# Patient Record
Sex: Female | Born: 1985 | Race: White | Hispanic: Yes | Marital: Married | Smoking: Never smoker
Health system: Southern US, Community
[De-identification: ages and names within clinical notes are randomized; demographics above are authoritative.]

## PROBLEM LIST (undated history)

## (undated) DIAGNOSIS — Z789 Other specified health status: Secondary | ICD-10-CM

## (undated) HISTORY — PX: NO PAST SURGERIES: SHX2092

---
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
## 2010-03-13 ENCOUNTER — Ambulatory Visit (HOSPITAL_COMMUNITY): Admission: RE | Admit: 2010-03-13 | Discharge: 2010-03-13 | Payer: Self-pay | Admitting: Family Medicine

## 2010-04-10 ENCOUNTER — Ambulatory Visit (HOSPITAL_COMMUNITY): Admission: RE | Admit: 2010-04-10 | Discharge: 2010-04-10 | Payer: Self-pay | Admitting: Family Medicine

## 2010-05-23 ENCOUNTER — Ambulatory Visit (HOSPITAL_COMMUNITY): Admission: RE | Admit: 2010-05-23 | Discharge: 2010-05-23 | Payer: Self-pay | Admitting: Family Medicine

## 2010-08-24 ENCOUNTER — Inpatient Hospital Stay (HOSPITAL_COMMUNITY)
Admission: AD | Admit: 2010-08-24 | Discharge: 2010-08-26 | Payer: Self-pay | Source: Home / Self Care | Admitting: Obstetrics & Gynecology

## 2010-12-01 LAB — CBC
HCT: 39.6 % (ref 36.0–46.0)
Hemoglobin: 12.9 g/dL (ref 12.0–15.0)
MCH: 25 pg — ABNORMAL LOW (ref 26.0–34.0)
MCHC: 32.4 g/dL (ref 30.0–36.0)
MCV: 77 fL — ABNORMAL LOW (ref 78.0–100.0)

## 2011-07-14 ENCOUNTER — Other Ambulatory Visit (HOSPITAL_COMMUNITY): Payer: Self-pay | Admitting: Nurse Practitioner

## 2011-07-14 DIAGNOSIS — Z3689 Encounter for other specified antenatal screening: Secondary | ICD-10-CM

## 2011-07-14 LAB — HEPATITIS B SURFACE ANTIGEN: Hepatitis B Surface Ag: NEGATIVE

## 2011-07-14 LAB — ABO/RH: RH Type: POSITIVE

## 2011-07-14 LAB — RPR: RPR: NONREACTIVE

## 2011-07-14 LAB — GC/CHLAMYDIA PROBE AMP, GENITAL: Chlamydia: NEGATIVE

## 2011-07-16 ENCOUNTER — Ambulatory Visit (HOSPITAL_COMMUNITY)
Admission: RE | Admit: 2011-07-16 | Discharge: 2011-07-16 | Disposition: A | Discharge status: Home / Self Care | Payer: Medicaid Other | Source: Ambulatory Visit | Attending: Nurse Practitioner | Admitting: Nurse Practitioner

## 2011-07-16 DIAGNOSIS — Z3689 Encounter for other specified antenatal screening: Secondary | ICD-10-CM

## 2011-07-16 DIAGNOSIS — Z1389 Encounter for screening for other disorder: Secondary | ICD-10-CM | POA: Insufficient documentation

## 2011-07-16 DIAGNOSIS — Z363 Encounter for antenatal screening for malformations: Secondary | ICD-10-CM | POA: Insufficient documentation

## 2011-07-16 DIAGNOSIS — O358XX0 Maternal care for other (suspected) fetal abnormality and damage, not applicable or unspecified: Secondary | ICD-10-CM | POA: Insufficient documentation

## 2011-09-22 NOTE — L&D Delivery Note (Signed)
Delivery Note At 7:45 PM a viable and healthy female was delivered via SVD(Presentation: LOA).  APGAR: 8,9 ; weight 3365.   Placenta status: intact, .  Cord: 3 vessel, with the following complications:none .    Anesthesia:  None Episiotomy: None Lacerations: None Suture Repair: n/a Est. Blood Loss (mL):  Mom to postpartum.  Baby to nursery-stable. Delivery supervised by Eino Farber [place] High 12/02/2011, 7:57 PM

## 2011-12-02 ENCOUNTER — Inpatient Hospital Stay (HOSPITAL_COMMUNITY)
Admission: AD | Admit: 2011-12-02 | Discharge: 2011-12-04 | DRG: 775 | Disposition: A | Discharge status: Home / Self Care | Payer: Medicaid Other | Source: Ambulatory Visit | Attending: Obstetrics & Gynecology | Admitting: Obstetrics & Gynecology

## 2011-12-02 ENCOUNTER — Encounter (HOSPITAL_COMMUNITY): Payer: Self-pay

## 2011-12-02 HISTORY — DX: Other specified health status: Z78.9

## 2011-12-02 LAB — CBC
MCH: 23.3 pg — ABNORMAL LOW (ref 26.0–34.0)
MCHC: 31.9 g/dL (ref 30.0–36.0)
Platelets: 213 10*3/uL (ref 150–400)
RDW: 14.7 % (ref 11.5–15.5)

## 2011-12-02 LAB — RPR: RPR Ser Ql: NONREACTIVE

## 2011-12-02 MED ORDER — LACTATED RINGERS IV SOLN
INTRAVENOUS | Status: DC
  Administered 2011-12-02: 17:00:00 via INTRAVENOUS

## 2011-12-02 MED ORDER — FLEET ENEMA 7-19 GM/118ML RE ENEM: 1.0000 | ENEMA | RECTAL | Status: DC | PRN

## 2011-12-02 MED ORDER — ONDANSETRON HCL 4 MG PO TABS: 4.0000 mg | ORAL_TABLET | ORAL | Status: DC | PRN

## 2011-12-02 MED ORDER — LIDOCAINE HCL (PF) 1 % IJ SOLN: 30.0000 mL | INTRAMUSCULAR | Status: DC | PRN

## 2011-12-02 MED ORDER — DIBUCAINE 1 % RE OINT: 1.0000 "application " | TOPICAL_OINTMENT | RECTAL | Status: DC | PRN

## 2011-12-02 MED ORDER — OXYTOCIN BOLUS FROM INFUSION: 500.0000 mL | Freq: Once | INTRAVENOUS | Status: DC

## 2011-12-02 MED ORDER — PRENATAL MULTIVITAMIN CH
1.0000 | ORAL_TABLET | Freq: Every day | ORAL | Status: DC
  Administered 2011-12-03 – 2011-12-04 (×2): 1 via ORAL
  Filled 2011-12-02 (×2): qty 1

## 2011-12-02 MED ORDER — OXYCODONE-ACETAMINOPHEN 5-325 MG PO TABS: 1.0000 | ORAL_TABLET | ORAL | Status: DC | PRN

## 2011-12-02 MED ORDER — OXYTOCIN 20 UNITS IN LACTATED RINGERS INFUSION - SIMPLE
125.0000 mL/h | Freq: Once | INTRAVENOUS | Status: AC
  Administered 2011-12-02: 500 mL/h via INTRAVENOUS
  Filled 2011-12-02: qty 1000

## 2011-12-02 MED ORDER — BUTORPHANOL TARTRATE 2 MG/ML IJ SOLN
1.0000 mg | INTRAMUSCULAR | Status: DC
  Administered 2011-12-02: 1 mg via INTRAVENOUS
  Filled 2011-12-02: qty 1

## 2011-12-02 MED ORDER — IBUPROFEN 600 MG PO TABS
600.0000 mg | ORAL_TABLET | Freq: Four times a day (QID) | ORAL | Status: DC
  Administered 2011-12-03 – 2011-12-04 (×7): 600 mg via ORAL
  Filled 2011-12-02 (×8): qty 1

## 2011-12-02 MED ORDER — ZOLPIDEM TARTRATE 5 MG PO TABS: 5.0000 mg | ORAL_TABLET | Freq: Every evening | ORAL | Status: DC | PRN

## 2011-12-02 MED ORDER — SIMETHICONE 80 MG PO CHEW: 80.0000 mg | CHEWABLE_TABLET | ORAL | Status: DC | PRN

## 2011-12-02 MED ORDER — LACTATED RINGERS IV SOLN: 500.0000 mL | INTRAVENOUS | Status: DC | PRN

## 2011-12-02 MED ORDER — ONDANSETRON HCL 4 MG/2ML IJ SOLN: 4.0000 mg | Freq: Four times a day (QID) | INTRAMUSCULAR | Status: DC | PRN

## 2011-12-02 MED ORDER — IBUPROFEN 600 MG PO TABS
600.0000 mg | ORAL_TABLET | Freq: Four times a day (QID) | ORAL | Status: DC | PRN
  Administered 2011-12-02: 600 mg via ORAL
  Filled 2011-12-02: qty 1

## 2011-12-02 MED ORDER — WITCH HAZEL-GLYCERIN EX PADS: 1.0000 "application " | MEDICATED_PAD | CUTANEOUS | Status: DC | PRN

## 2011-12-02 MED ORDER — ONDANSETRON HCL 4 MG/2ML IJ SOLN: 4.0000 mg | INTRAMUSCULAR | Status: DC | PRN

## 2011-12-02 MED ORDER — BUTORPHANOL TARTRATE 2 MG/ML IJ SOLN
1.0000 mg | INTRAMUSCULAR | Status: DC | PRN
  Administered 2011-12-02: 1 mg via INTRAVENOUS
  Filled 2011-12-02: qty 1

## 2011-12-02 MED ORDER — CITRIC ACID-SODIUM CITRATE 334-500 MG/5ML PO SOLN: 30.0000 mL | ORAL | Status: DC | PRN

## 2011-12-02 MED ORDER — LIDOCAINE HCL (PF) 1 % IJ SOLN
30.0000 mL | INTRAMUSCULAR | Status: DC | PRN
  Filled 2011-12-02: qty 30

## 2011-12-02 MED ORDER — SENNOSIDES-DOCUSATE SODIUM 8.6-50 MG PO TABS
2.0000 | ORAL_TABLET | Freq: Every day | ORAL | Status: DC
  Administered 2011-12-03: 2 via ORAL

## 2011-12-02 MED ORDER — OXYTOCIN BOLUS FROM INFUSION
500.0000 mL | Freq: Once | INTRAVENOUS | Status: DC
  Filled 2011-12-02: qty 500

## 2011-12-02 MED ORDER — TETANUS-DIPHTH-ACELL PERTUSSIS 5-2.5-18.5 LF-MCG/0.5 IM SUSP
0.5000 mL | Freq: Once | INTRAMUSCULAR | Status: AC
  Administered 2011-12-03: 0.5 mL via INTRAMUSCULAR
  Filled 2011-12-02: qty 0.5

## 2011-12-02 MED ORDER — ACETAMINOPHEN 325 MG PO TABS: 650.0000 mg | ORAL_TABLET | ORAL | Status: DC | PRN

## 2011-12-02 MED ORDER — IBUPROFEN 600 MG PO TABS: 600.0000 mg | ORAL_TABLET | Freq: Four times a day (QID) | ORAL | Status: DC | PRN

## 2011-12-02 MED ORDER — BENZOCAINE-MENTHOL 20-0.5 % EX AERO
1.0000 "application " | INHALATION_SPRAY | CUTANEOUS | Status: DC | PRN
  Administered 2011-12-03: 1 via TOPICAL

## 2011-12-02 MED ORDER — DIPHENHYDRAMINE HCL 25 MG PO CAPS: 25.0000 mg | ORAL_CAPSULE | Freq: Four times a day (QID) | ORAL | Status: DC | PRN

## 2011-12-02 MED ORDER — OXYCODONE-ACETAMINOPHEN 5-325 MG PO TABS
1.0000 | ORAL_TABLET | ORAL | Status: DC | PRN
  Administered 2011-12-02 – 2011-12-03 (×5): 1 via ORAL
  Filled 2011-12-02 (×5): qty 1

## 2011-12-02 MED ORDER — OXYTOCIN 20 UNITS IN LACTATED RINGERS INFUSION - SIMPLE: 125.0000 mL/h | Freq: Once | INTRAVENOUS | Status: DC

## 2011-12-02 MED ORDER — LANOLIN HYDROUS EX OINT: TOPICAL_OINTMENT | CUTANEOUS | Status: DC | PRN

## 2011-12-02 MED ORDER — LACTATED RINGERS IV SOLN: INTRAVENOUS | Status: DC

## 2011-12-02 NOTE — MAU Provider Note (Signed)
  History     CSN: [id number]  Arrival date and time: 12/02/11 1336   None     Chief Complaint  Patient presents with  . Labor Eval   HPI  Christy David is a 26 y.o. [id number] who presents at 38 weeks 4 days from the clinic. She was 3-4cm today. She has been having contractions since 10, and is now having them every 3 minutes. No discharge, bleeding or gush of fluid. No complications with pregnancy. EDD 12/12/11 based on U/S. Pediatrician is [facility]. Birth control will be Mirena. [facility] EDD 12/12/11 based on U/S.   Past Medical History  Diagnosis Date  . No pertinent past medical history     Past Surgical History  Procedure Date  . No past surgeries     Family History  Problem Relation Age of Onset  . Anesthesia problems Neg Hx     History  Substance Use Topics  . Smoking status: Never Smoker   . Smokeless tobacco: Never Used  . Alcohol Use: No    Allergies: No Known Allergies  Prescriptions prior to admission  Medication Sig Dispense Refill  . Prenatal Vit-Fe Fumarate-FA (PRENATAL MULTIVITAMIN) TABS Take 1 tablet by mouth daily.        Review of Systems  Constitutional: Negative for fever and diaphoresis.  Eyes: Negative for blurred vision, double vision and photophobia.  Respiratory: Negative for shortness of breath.   Cardiovascular: Negative for chest pain.  Gastrointestinal: Positive for abdominal pain (contractions). Negative for nausea, vomiting and diarrhea.  Genitourinary: Negative for dysuria and urgency.  Skin: Negative for rash.  Neurological: Negative for headaches.   Dilation: 3.5 Effacement (%): 80 Cervical Position: Middle Station: -2 Presentation: Vertex Exam by:: L.Paschal,RN  On recheck: Dilation: 4-5  Physical Exam   Blood pressure 120/75, pulse 92, temperature 97.2 F (36.2 C), temperature source Oral, resp. rate 20, height 5' 1.5" (1.562 m), weight 89.086 kg (196 lb 6.4  oz).  Physical Exam  Constitutional: She is oriented to person, place, and time. She appears well-developed and well-nourished. No distress.  HENT:  Head: Normocephalic and atraumatic.  Eyes: EOM are normal.  Neck: Normal range of motion.  Cardiovascular: Normal rate and regular rhythm.   Respiratory: Effort normal.  GI: Soft. She exhibits distension (gravid). There is no tenderness. There is no rebound and no guarding.  Musculoskeletal: Normal range of motion.  Neurological: She is alert and oriented to person, place, and time.  Skin: Skin is warm and dry. She is not diaphoretic. No erythema.  Psychiatric: She has a normal mood and affect. Her behavior is normal. Judgment and thought content normal.   GBS negative 1 hr glucose WNL  MAU Course  Procedures  FHR: 140 baseline, moderate variability, no accels, no decels Contractions every 4-5 minutes  Assessment and Plan   26 y.o. Christy David who presents at 38 weeks 4 days from the clinic IUP Admit for labor and delivery Anticipate NSVD Currently does not want an epidural Mirena for contraception Pediatrician [facility] Discussed and seen with Sid Falcon, CNM  Ala Dach 12/02/2011, 2:31 PM

## 2011-12-02 NOTE — H&P (Signed)
   Chief Complaint  Patient presents with  . Labor Eval   HPI  Christy David is a 25 y.o. [id number] who presents at 38 weeks 4 days from the clinic. She was 3-4cm today. She has been having contractions since 10, and is now having them every 3 minutes. No discharge, bleeding or gush of fluid. No complications with pregnancy. EDD 12/12/11 based on U/S. Pediatrician is [facility]. Birth control will be Mirena. [facility]. EDD 12/12/11 based on U/S.   OB History    Grav Para Term Preterm Abortions TAB SAB Ect Mult Living   3 2 2       2       Past Medical History  Diagnosis Date  . No pertinent past medical history     Past Surgical History  Procedure Date  . No past surgeries     Family History  Problem Relation Age of Onset  . Anesthesia problems Neg Hx     History  Substance Use Topics  . Smoking status: Never Smoker   . Smokeless tobacco: Never Used  . Alcohol Use: No    Allergies: No Known Allergies  Prescriptions prior to admission  Medication Sig Dispense Refill  . Prenatal Vit-Fe Fumarate-FA (PRENATAL MULTIVITAMIN) TABS Take 1 tablet by mouth daily.        Review of Systems  Constitutional: Negative for fever and diaphoresis.  Eyes: Negative for blurred vision, double vision and photophobia.  Respiratory: Negative for shortness of breath.   Cardiovascular: Negative for chest pain.  Gastrointestinal: Positive for abdominal pain (contractions). Negative for nausea, vomiting and diarrhea.  Genitourinary: Negative for dysuria and urgency.  Skin: Negative for rash.  Neurological: Negative for headaches.   Dilation: 3.5 Effacement (%): 80 Cervical Position: Middle Station: -2 Presentation: Vertex Exam by:: L.Paschal,RN  On recheck: Dilation: 4-5  Physical Exam   Blood pressure 120/75, pulse 92, temperature 97.2 F (36.2 C), temperature source Oral, resp. rate 20, height 5' 1.5" (1.562 m), weight 89.086  kg (196 lb 6.4 oz).  Physical Exam  Constitutional: She is oriented to person, place, and time. She appears well-developed and well-nourished. No distress.  HENT:  Head: Normocephalic and atraumatic.  Eyes: EOM are normal.  Neck: Normal range of motion.  Cardiovascular: Normal rate and regular rhythm.   Respiratory: Effort normal.  GI: Soft. She exhibits distension (gravid). There is no tenderness. There is no rebound and no guarding.  Musculoskeletal: Normal range of motion.  Neurological: She is alert and oriented to person, place, and time.  Skin: Skin is warm and dry. She is not diaphoretic. No erythema.  Psychiatric: She has a normal mood and affect. Her behavior is normal. Judgment and thought content normal.   GBS negative 1 hr glucose WNL  MAU Course  Procedures  FHR: 140 baseline, moderate variability, no accels, no decels Contractions every 4-5 minutes  Assessment and Plan   26 y.o. [id number] who presents at 38 weeks 4 days from the clinic IUP Admit for labor and delivery Anticipate NSVD Currently does not want an epidural Mirena for contraception Pediatrician [facility] Discussed and seen with Sid Falcon, CNM  Ala Dach 12/02/2011, 2:31 PM

## 2011-12-02 NOTE — Progress Notes (Signed)
Subjective: Patient having increased pressure, discomfort with contractions.  Objective: BP 123/80  Pulse 112  Temp(Src) 98 F (36.7 C) (Oral)  Resp 20  Ht 5' 1.5" (1.562 m)  Wt 89.086 kg (196 lb 6.4 oz)  BMI 36.51 kg/m2      FHT:  FHR: 135 bpm, variability: moderate,  accelerations:  Abscent,  decelerations:  Absent UC:   regular, every 3-4 minutes SVE:   Dilation: 6 Effacement (%): 100 Station: -2 Exam by:: lee  Labs: Lab Results  Component Value Date   WBC 10.5 12/02/2011   HGB 10.8* 12/02/2011   HCT 33.9* 12/02/2011   MCV 73.2* 12/02/2011   PLT 213 12/02/2011    Assessment / Plan: Spontaneous labor, progressing normally  Labor: Progressing normally Preeclampsia:  n/a Fetal Wellbeing:  Category I Pain Control:  Labor support with stadol I/D:  n/a Anticipated MOD:  NSVD  Ala Dach 12/02/2011, 6:34 PM

## 2011-12-02 NOTE — MAU Note (Signed)
Patient state contractions every 15 minutes. Denies any bleeding or leaking. Reports good fetal movement.

## 2011-12-02 NOTE — Progress Notes (Signed)
Pt transferred to 136 via wheelchair with support person present at 2105.  Report given to [place] on mbw

## 2011-12-03 MED ORDER — BENZOCAINE-MENTHOL 20-0.5 % EX AERO
INHALATION_SPRAY | CUTANEOUS | Status: AC
  Administered 2011-12-03: 1 via TOPICAL
  Filled 2011-12-03: qty 56

## 2011-12-03 NOTE — Progress Notes (Signed)
Post Partum Day 1 Subjective: no complaints, up ad lib and voiding No stool yet. Nothing to eat yet.  Objective: Blood pressure 101/70, pulse 59, temperature 98 F (36.7 C), temperature source Oral, resp. rate 18, height 5' 1.5" (1.562 m), weight 89.086 kg (196 lb 6.4 oz), unknown if currently breastfeeding.  Physical Exam:  General: alert, cooperative and no distress Lochia: appropriate Uterine Fundus: firm Incision: n/a DVT Evaluation: No evidence of DVT seen on physical exam. Negative Homan's sign.   Basename 12/02/11 1700  HGB 10.8*  HCT 33.9*    Assessment/Plan: Plan for discharge tomorrow, Breastfeeding and Contraception Mirena   LOS: 1 day   Ala Dach 12/03/2011, 7:29 AM

## 2011-12-03 NOTE — MAU Provider Note (Signed)
Attestation of Attending Supervision of Resident: Evaluation and management procedures were performed by the [facility] Medicine Resident under my supervision.  I have reviewed the resident's note and chart, and I agree with management and plan.  Jaynie Collins, M.D. 12/03/2011 9:06 AM

## 2011-12-03 NOTE — H&P (Signed)
Attestation of Attending Supervision of Advanced Practitioner: Evaluation and management procedures were performed by the PA/NP/CNM/OB Fellow under my supervision/collaboration. Chart reviewed, and agree with management and plan.  Jaynie Collins, M.D. 12/03/2011 9:07 AM

## 2011-12-04 MED ORDER — IBUPROFEN 600 MG PO TABS: 600.0000 mg | ORAL_TABLET | Freq: Four times a day (QID) | ORAL | Status: AC

## 2011-12-04 NOTE — Progress Notes (Signed)
Post Partum Day 2 Subjective: no complaints, up ad lib, voiding, tolerating PO and + flatus  Objective: Blood pressure 95/61, pulse 61, temperature 98 F (36.7 C), temperature source Oral, resp. rate 18, height 5' 1.5" (1.562 m), weight 89.086 kg (196 lb 6.4 oz), unknown if currently breastfeeding.  Physical Exam:  General: alert, cooperative and no distress Lochia: appropriate Uterine Fundus: firm Incision: n/a DVT Evaluation: No evidence of DVT seen on physical exam. Negative Homan's sign.   Basename 12/02/11 1700  HGB 10.8*  HCT 33.9*    Assessment/Plan: Discharge home, Breastfeeding and Contraception Mirena   LOS: 2 days   Ala Dach 12/04/2011, 7:40 AM

## 2011-12-04 NOTE — Discharge Summary (Signed)
Obstetric Discharge Summary Reason for Admission: onset of labor Prenatal Procedures: none Intrapartum Procedures: spontaneous vaginal delivery Postpartum Procedures: none Complications-Operative and Postpartum: none Hemoglobin  Date Value Range Status  12/02/2011 10.8* 12.0-15.0 (g/dL) Final     HCT  Date Value Range Status  12/02/2011 33.9* 36.0-46.0 (%) Final    Physical Exam:  General: alert, cooperative and no distress Lochia: appropriate Uterine Fundus: firm Incision: n/a DVT Evaluation: No evidence of DVT seen on physical exam. Negative Homan's sign.  Discharge Diagnoses: Term Pregnancy-delivered  Discharge Information: Date: 12/04/2011 Activity: unrestricted Diet: routine Medications: Ibuprofen Condition: stable and improved Instructions: refer to practice specific booklet Discharge to: home Follow-up Information    Follow up with [facility] [place]. Schedule an appointment as soon as possible for a visit in 6 weeks.   Contact information:   [address] [employer] [place] [address]          Newborn Data: Live born female  Birth Weight: 7 lb 6.7 oz (3365 g) APGAR: 8, 9  Home with mother.  Ala Dach 12/04/2011, 7:42 AM

## 2011-12-04 NOTE — Discharge Instructions (Signed)

## 2011-12-04 NOTE — Progress Notes (Signed)
UR chart review completed.  

## 2012-10-05 IMAGING — US US OB DETAIL+14 WK
2 series · 12 of 28 positions shown · non-contrast
Comparison: none

[Series 1: us ob detail +14 wk · 11 of 50 slices shown (1 of 2)]
[im 3/50]
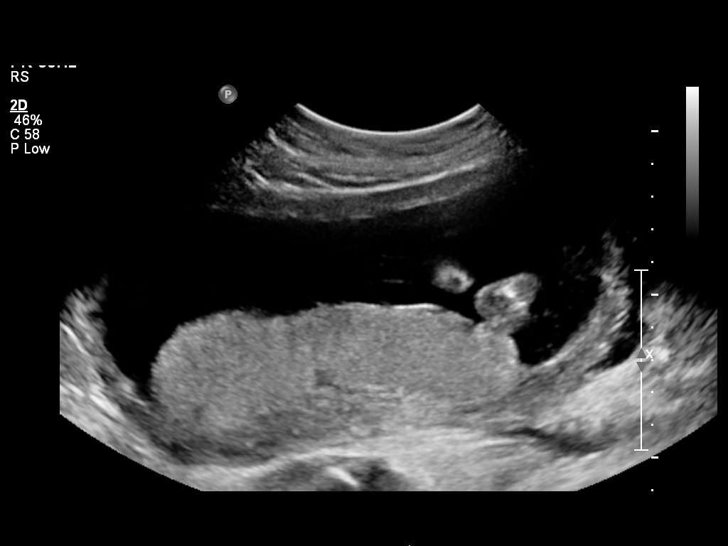
[im 7/50]
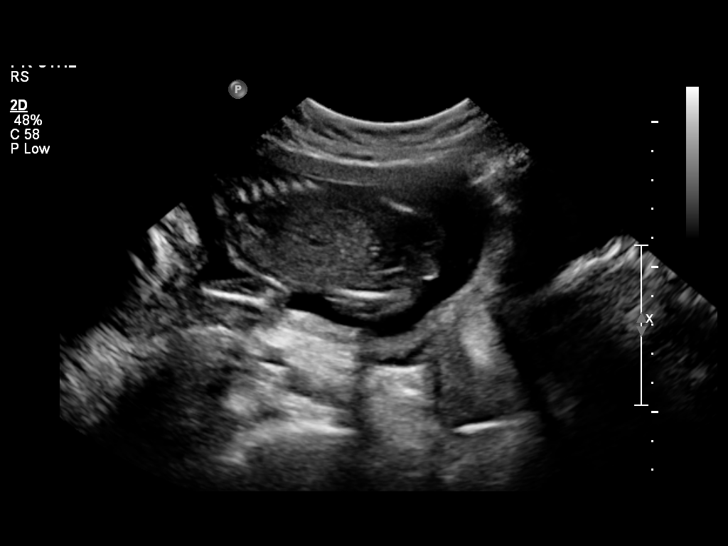
[im 11/50]
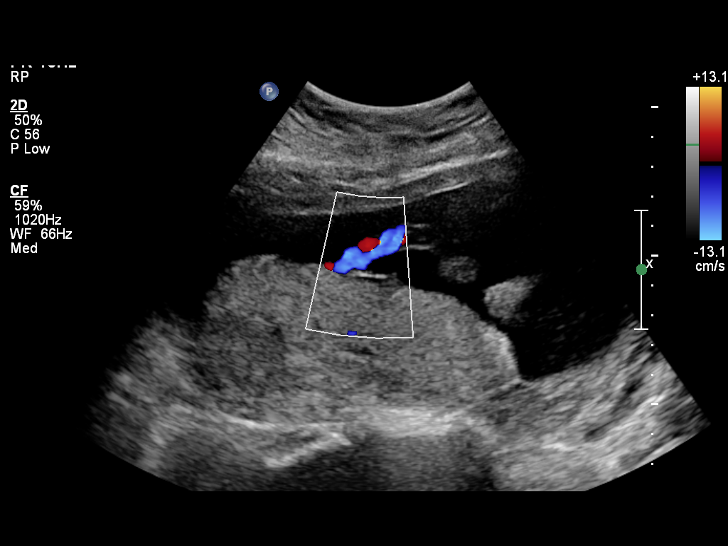
[im 17/50]
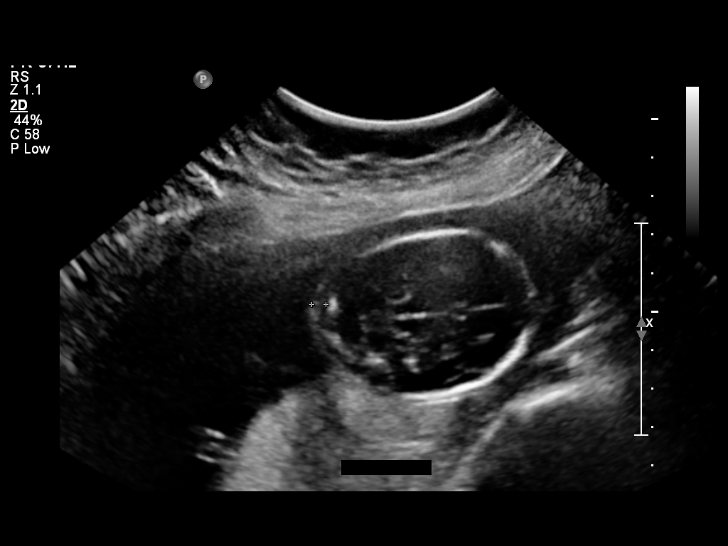
[im 21/50]
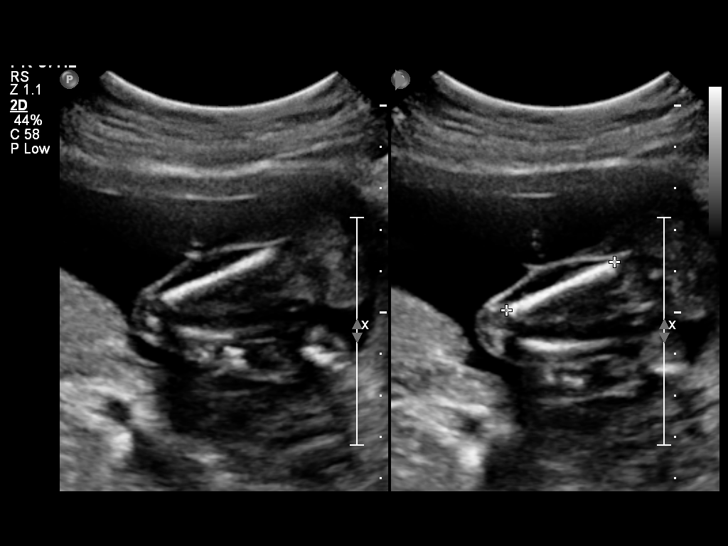
[im 25/50]
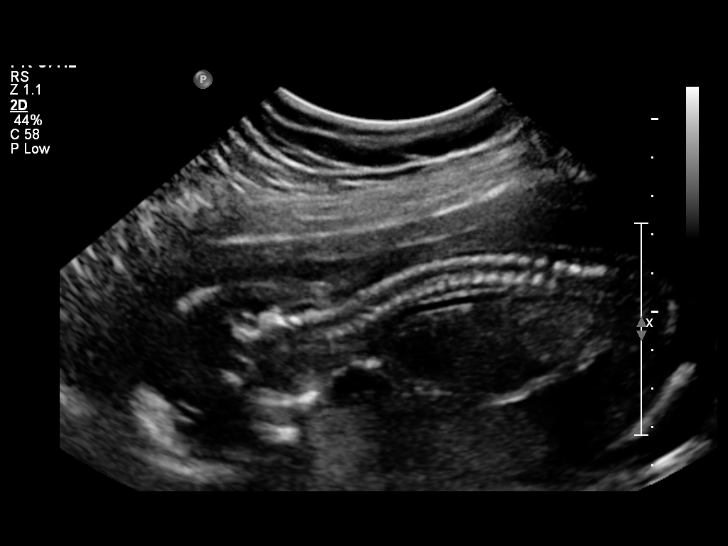
[im 31/50]
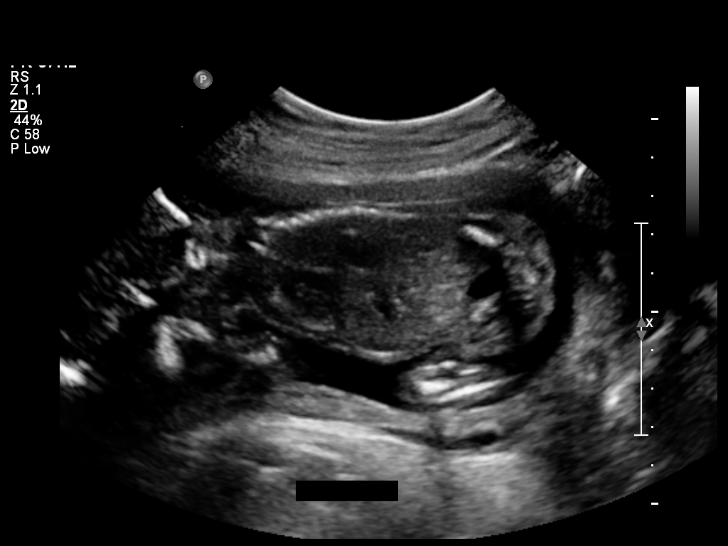
[im 35/50]
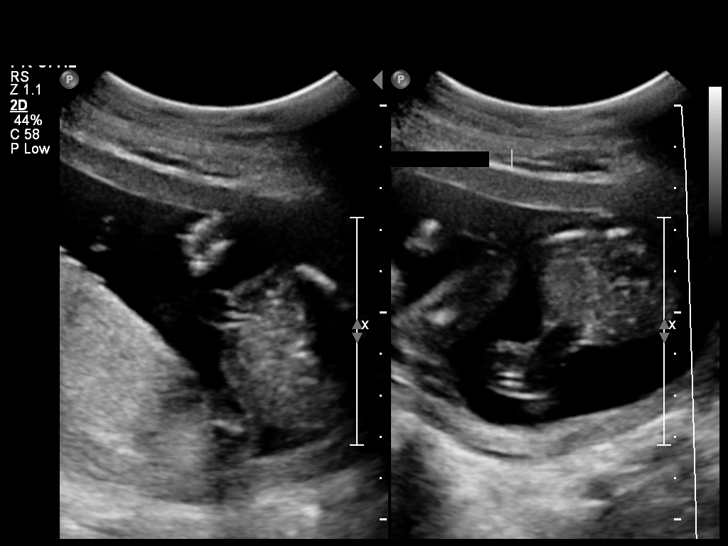
[im 39/50]
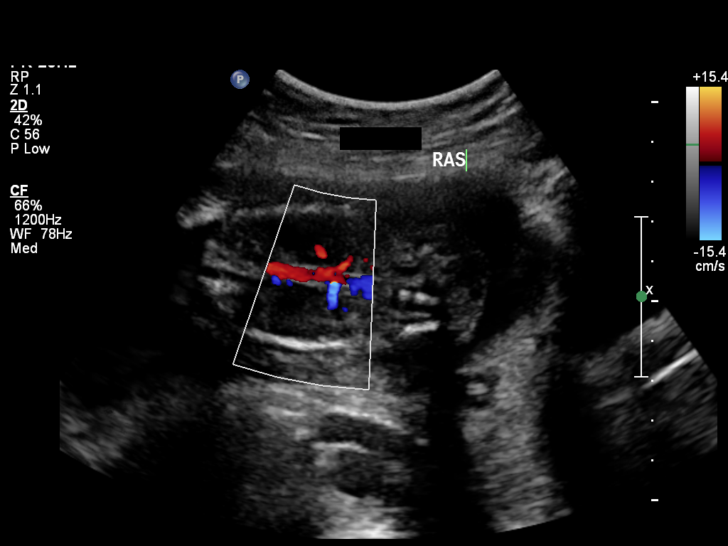
[im 45/50]
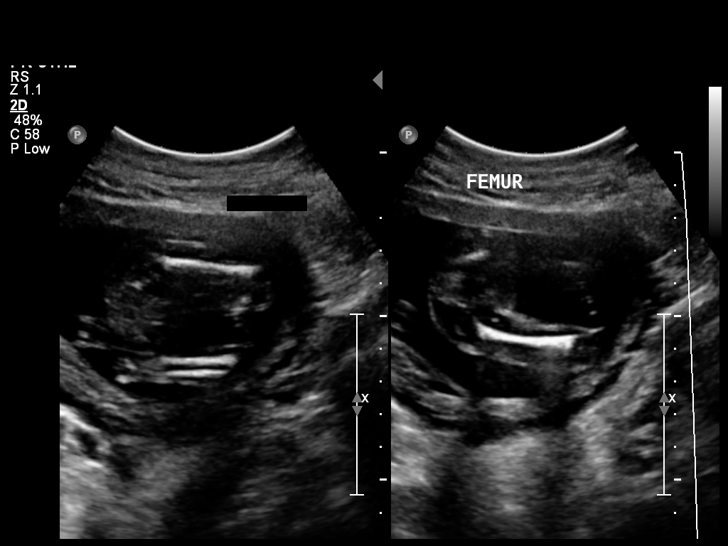
[im 50/50]
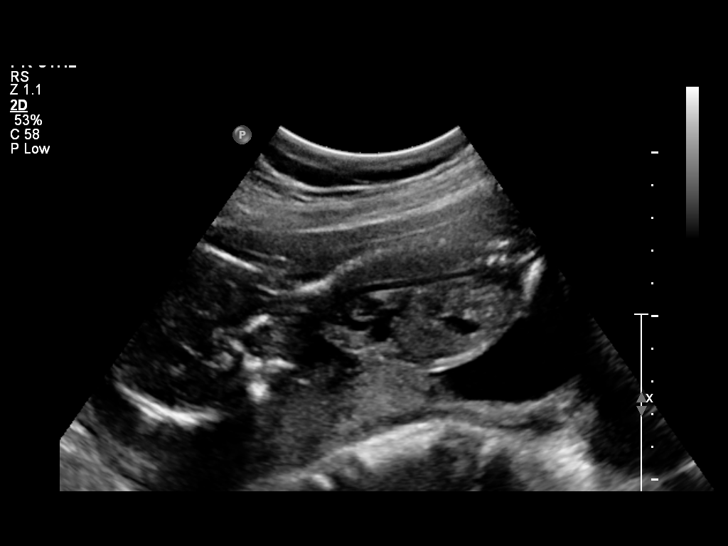

[Series 1: us ob detail +14 wk · 1 of 6 slices shown (2 of 2)]
[im 3/6]
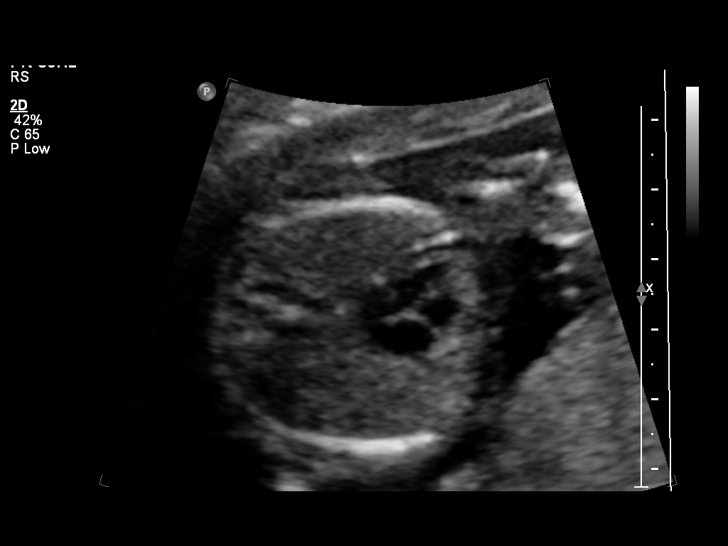

[12 of 28 positions shown; findings below may reference images not displayed]

OBSTETRICS REPORT
                      (Signed Final 07/16/2011 [DATE])

          MARKEITH

 Order#:         [id number]
Procedures

 US OB DETAIL + 14 WK                                  76811.0
Indications

 Detailed fetal anatomic survey
 Unsure of LMP;  Establish Gestational [AGE]
Fetal Evaluation

 Fetal Heart Rate:  146                          bpm
 Cardiac Activity:  Observed
 Presentation:      Breech
 Placenta:          Posterior, above cervical
                    os
 P. Cord Insertion: Visualized

 Amniotic Fluid
 AFI FV:      Subjectively within normal limits
                                              Larg Pckt:    4.6  cm
Biometry

 BPD:     41.3  mm     G. Age:  18w 3d                CI:        69.08   70 - 86
                                                      FL/HC:       18.0  16.1 -

 HC:     158.7  mm     G. Age:  18w 5d       44  %    HC/AC:       1.19  1.09 -

 AC:     133.6  mm     G. Age:  18w 6d       51  %    FL/BPD:
 FL:      28.5  mm     G. Age:  18w 5d       45  %    FL/AC:       21.3  20 - 24
 HUM:     27.4  mm     G. Age:  18w 5d       53  %
 NFT:     3.68  mm

 Est. FW:     257  gm      0 lb 9 oz     47  %
Gestational Age

 LMP:           19w 0d        Date:  03/05/11                 EDD:   12/10/11
 U/S Today:     18w 5d                                        EDD:   12/12/11
 Best:          18w 5d     Det. By:  U/S (07/16/11)           EDD:   12/12/11
Anatomy

 Cranium:           Appears normal      Aortic Arch:       Appears normal
 Fetal Cavum:       Appears normal      Ductal Arch:       Appears normal
 Ventricles:        Appears normal      Diaphragm:         Appears normal
 Choroid Plexus:    Appears normal      Stomach:           Appears normal,
                                                           left sided
 Cerebellum:        Appears normal      Abdomen:           Appears normal
 Posterior Fossa:   Appears normal      Abdominal Wall:    Appears nml
                                                           (cord insert, abd
                                                           wall)
 Nuchal Fold:       Appears normal      Cord Vessels:      Appears normal
                    (neck, nuchal                          (3 vessel cord)
                    fold)
 Face:              Appears normal      Kidneys:           Appear normal
                    (lips/profile/orbits
                    )
 Heart:             Appears normal      Bladder:           Appears normal
                    (4 chamber &
                    axis)
 RVOT:              Appears normal      Spine:             Appears normal
 LVOT:              Appears normal      Limbs:             Appears normal
                                                           (hands, ankles,
                                                           feet)

 Other:     Male gender. Heels and 5th digit visualized.
Cervix Uterus Adnexa

 Cervical Length:    3.3       cm

 Cervix:       Closed.

 Left Ovary:    Within normal limits.
 Right Ovary:   Within normal limits.

 Adnexa:     No abnormality visualized.
Impression

 Siup demonstrating an EGA by ultrasound of 18w 5d. This
 was recorded as best gestational age as the patient's LMP
 was given to be uncertain.

 No focal fetal or placental abnormalities are noted with a good
 anatomic evaluation possible. No soft markers for Down
 Syndrome are seen. Given the expected age at delivery of 25,
 today's normal ultrasound would decrease the age related risk
 for Down Syndrome from [DATE] to [DATE] (Rudi et al).
 Correlation with other aneuploidy screening results, if
 available, would be recommended for a more complete risk
 assessment.

 Subjectively and quantitatively normal amniotic fluid volume.
 Normal cervical length.

## 2014-03-08 ENCOUNTER — Other Ambulatory Visit: Payer: Self-pay | Admitting: Nurse Practitioner

## 2014-03-08 DIAGNOSIS — N644 Mastodynia: Secondary | ICD-10-CM

## 2014-03-08 DIAGNOSIS — N6459 Other signs and symptoms in breast: Secondary | ICD-10-CM

## 2014-03-12 ENCOUNTER — Ambulatory Visit
Admission: RE | Admit: 2014-03-12 | Discharge: 2014-03-12 | Disposition: A | Discharge status: Home / Self Care | Payer: No Typology Code available for payment source | Source: Ambulatory Visit | Attending: Nurse Practitioner | Admitting: Nurse Practitioner

## 2014-03-12 DIAGNOSIS — N6459 Other signs and symptoms in breast: Secondary | ICD-10-CM

## 2014-03-12 DIAGNOSIS — N644 Mastodynia: Secondary | ICD-10-CM

## 2014-07-23 ENCOUNTER — Encounter (HOSPITAL_COMMUNITY): Payer: Self-pay

## 2014-08-02 ENCOUNTER — Other Ambulatory Visit (HOSPITAL_COMMUNITY): Payer: Self-pay | Admitting: Urology

## 2014-08-02 DIAGNOSIS — Z3689 Encounter for other specified antenatal screening: Secondary | ICD-10-CM

## 2014-08-02 LAB — OB RESULTS CONSOLE GC/CHLAMYDIA
Chlamydia: NEGATIVE
Gonorrhea: NEGATIVE

## 2014-08-02 LAB — OB RESULTS CONSOLE ABO/RH: RH Type: POSITIVE

## 2014-08-02 LAB — OB RESULTS CONSOLE RPR: RPR: NONREACTIVE

## 2014-08-02 LAB — OB RESULTS CONSOLE ANTIBODY SCREEN: ANTIBODY SCREEN: NEGATIVE

## 2014-08-02 LAB — OB RESULTS CONSOLE RUBELLA ANTIBODY, IGM: RUBELLA: IMMUNE

## 2014-08-02 LAB — OB RESULTS CONSOLE HIV ANTIBODY (ROUTINE TESTING): HIV: NONREACTIVE

## 2014-08-02 LAB — OB RESULTS CONSOLE HEPATITIS B SURFACE ANTIGEN: HEP B S AG: NEGATIVE

## 2014-08-14 ENCOUNTER — Ambulatory Visit (HOSPITAL_COMMUNITY)
Admission: RE | Admit: 2014-08-14 | Discharge: 2014-08-14 | Disposition: A | Discharge status: Home / Self Care | Payer: Medicaid Other | Source: Ambulatory Visit | Attending: Urology | Admitting: Urology

## 2014-08-14 ENCOUNTER — Encounter (HOSPITAL_COMMUNITY): Payer: Self-pay

## 2014-08-14 ENCOUNTER — Other Ambulatory Visit (HOSPITAL_COMMUNITY): Payer: Self-pay | Admitting: Urology

## 2014-08-14 DIAGNOSIS — Z3689 Encounter for other specified antenatal screening: Secondary | ICD-10-CM | POA: Insufficient documentation

## 2014-08-14 DIAGNOSIS — Z36 Encounter for antenatal screening of mother: Secondary | ICD-10-CM | POA: Insufficient documentation

## 2014-08-14 DIAGNOSIS — O99210 Obesity complicating pregnancy, unspecified trimester: Secondary | ICD-10-CM | POA: Diagnosis present

## 2014-08-14 DIAGNOSIS — Z3A19 19 weeks gestation of pregnancy: Secondary | ICD-10-CM | POA: Insufficient documentation

## 2014-08-30 ENCOUNTER — Other Ambulatory Visit (HOSPITAL_COMMUNITY): Payer: Self-pay | Admitting: Urology

## 2014-08-30 DIAGNOSIS — Z3689 Encounter for other specified antenatal screening: Secondary | ICD-10-CM

## 2014-09-17 ENCOUNTER — Ambulatory Visit (HOSPITAL_COMMUNITY)
Admission: RE | Admit: 2014-09-17 | Discharge: 2014-09-17 | Disposition: A | Discharge status: Home / Self Care | Payer: Medicaid Other | Source: Ambulatory Visit | Attending: Urology | Admitting: Urology

## 2014-09-21 NOTE — L&D Delivery Note (Signed)
  Delivery Note At 3:51pm  a viable female was delivered over intact perineum.  APGAR: 9, 9 ; weight pending  .   Placenta status: spontaneous, intact.  Cord: 3 vessel with the following complications: loose nuchal x1.   Anesthesia:  epidural Episiotomy:  n/a Lacerations:  none Suture Repair: none Est. Blood Loss (100 mL):    Mom to postpartum.  Baby to Couplet care / Skin to Skin.    Kandis FantasiaSenmiao, Zhan Medical Student 3 12/21/2014, 4:13 PM    I was present for the entire delivery of baby and placenta and agree with above.  Rhea PinkLori A Mekhia Brogan, CNM 12/21/2014 4:34 PM

## 2014-10-04 ENCOUNTER — Other Ambulatory Visit (HOSPITAL_COMMUNITY): Payer: Self-pay | Admitting: Nurse Practitioner

## 2014-10-04 DIAGNOSIS — Z3689 Encounter for other specified antenatal screening: Secondary | ICD-10-CM

## 2014-10-10 ENCOUNTER — Ambulatory Visit (HOSPITAL_COMMUNITY)
Admission: RE | Admit: 2014-10-10 | Discharge: 2014-10-10 | Disposition: A | Discharge status: Home / Self Care | Payer: Medicaid Other | Source: Ambulatory Visit | Attending: Nurse Practitioner | Admitting: Nurse Practitioner

## 2014-10-10 DIAGNOSIS — Z0489 Encounter for examination and observation for other specified reasons: Secondary | ICD-10-CM | POA: Insufficient documentation

## 2014-10-10 DIAGNOSIS — Z3A27 27 weeks gestation of pregnancy: Secondary | ICD-10-CM | POA: Diagnosis not present

## 2014-10-10 DIAGNOSIS — Z3689 Encounter for other specified antenatal screening: Secondary | ICD-10-CM

## 2014-10-10 DIAGNOSIS — Z36 Encounter for antenatal screening of mother: Secondary | ICD-10-CM | POA: Diagnosis not present

## 2014-10-10 DIAGNOSIS — IMO0002 Reserved for concepts with insufficient information to code with codable children: Secondary | ICD-10-CM | POA: Insufficient documentation

## 2014-12-20 ENCOUNTER — Telehealth (HOSPITAL_COMMUNITY): Payer: Self-pay | Admitting: *Deleted

## 2014-12-20 ENCOUNTER — Inpatient Hospital Stay (HOSPITAL_COMMUNITY)
Admission: RE | Admit: 2014-12-20 | Discharge: 2014-12-23 | DRG: 775 | Disposition: A | Discharge status: Home / Self Care | Payer: Medicaid Other | Source: Ambulatory Visit | Attending: Obstetrics & Gynecology | Admitting: Obstetrics & Gynecology

## 2014-12-20 ENCOUNTER — Encounter (HOSPITAL_COMMUNITY): Payer: Self-pay | Admitting: *Deleted

## 2014-12-20 ENCOUNTER — Encounter (HOSPITAL_COMMUNITY): Payer: Self-pay

## 2014-12-20 DIAGNOSIS — Z833 Family history of diabetes mellitus: Secondary | ICD-10-CM

## 2014-12-20 DIAGNOSIS — O26619 Liver and biliary tract disorders in pregnancy, unspecified trimester: Secondary | ICD-10-CM

## 2014-12-20 DIAGNOSIS — O2662 Liver and biliary tract disorders in childbirth: Secondary | ICD-10-CM | POA: Diagnosis present

## 2014-12-20 DIAGNOSIS — K831 Obstruction of bile duct: Secondary | ICD-10-CM | POA: Diagnosis present

## 2014-12-20 DIAGNOSIS — Z3A37 37 weeks gestation of pregnancy: Secondary | ICD-10-CM | POA: Diagnosis present

## 2014-12-20 DIAGNOSIS — O26613 Liver and biliary tract disorders in pregnancy, third trimester: Secondary | ICD-10-CM | POA: Diagnosis present

## 2014-12-20 DIAGNOSIS — O99824 Streptococcus B carrier state complicating childbirth: Secondary | ICD-10-CM | POA: Diagnosis present

## 2014-12-20 HISTORY — DX: Other specified health status: Z78.9

## 2014-12-20 LAB — CBC
HCT: 33.5 % — ABNORMAL LOW (ref 36.0–46.0)
Hemoglobin: 11.1 g/dL — ABNORMAL LOW (ref 12.0–15.0)
MCH: 25 pg — ABNORMAL LOW (ref 26.0–34.0)
MCHC: 33.1 g/dL (ref 30.0–36.0)
MCV: 75.5 fL — ABNORMAL LOW (ref 78.0–100.0)
Platelets: 167 10*3/uL (ref 150–400)
RBC: 4.44 MIL/uL (ref 3.87–5.11)
RDW: 13.3 % (ref 11.5–15.5)
WBC: 7.6 10*3/uL (ref 4.0–10.5)

## 2014-12-20 LAB — OB RESULTS CONSOLE GBS: GBS: POSITIVE

## 2014-12-20 LAB — TYPE AND SCREEN
ABO/RH(D): A POS
Antibody Screen: NEGATIVE

## 2014-12-20 LAB — ABO/RH: ABO/RH(D): A POS

## 2014-12-20 MED ORDER — PENICILLIN G POTASSIUM 5000000 UNITS IJ SOLR
2.5000 10*6.[IU] | INTRAVENOUS | Status: DC
  Filled 2014-12-20 (×2): qty 2.5

## 2014-12-20 MED ORDER — CITRIC ACID-SODIUM CITRATE 334-500 MG/5ML PO SOLN: 30.0000 mL | ORAL | Status: DC | PRN

## 2014-12-20 MED ORDER — LIDOCAINE HCL (PF) 1 % IJ SOLN
30.0000 mL | INTRAMUSCULAR | Status: DC | PRN
  Filled 2014-12-20: qty 30

## 2014-12-20 MED ORDER — PENICILLIN G POTASSIUM 5000000 UNITS IJ SOLR
5.0000 10*6.[IU] | Freq: Once | INTRAVENOUS | Status: DC
  Filled 2014-12-20: qty 5

## 2014-12-20 MED ORDER — OXYTOCIN 40 UNITS IN LACTATED RINGERS INFUSION - SIMPLE MED
62.5000 mL/h | INTRAVENOUS | Status: DC
  Filled 2014-12-20: qty 1000

## 2014-12-20 MED ORDER — LACTATED RINGERS IV SOLN
500.0000 mL | INTRAVENOUS | Status: DC | PRN
  Administered 2014-12-21: 500 mL via INTRAVENOUS
  Administered 2014-12-21: 250 mL via INTRAVENOUS

## 2014-12-20 MED ORDER — OXYCODONE-ACETAMINOPHEN 5-325 MG PO TABS: 2.0000 | ORAL_TABLET | ORAL | Status: DC | PRN

## 2014-12-20 MED ORDER — OXYCODONE-ACETAMINOPHEN 5-325 MG PO TABS: 1.0000 | ORAL_TABLET | ORAL | Status: DC | PRN

## 2014-12-20 MED ORDER — ACETAMINOPHEN 325 MG PO TABS: 650.0000 mg | ORAL_TABLET | ORAL | Status: DC | PRN

## 2014-12-20 MED ORDER — OXYTOCIN BOLUS FROM INFUSION: 500.0000 mL | INTRAVENOUS | Status: DC

## 2014-12-20 MED ORDER — LACTATED RINGERS IV SOLN
INTRAVENOUS | Status: DC
  Administered 2014-12-20 – 2014-12-21 (×4): via INTRAVENOUS

## 2014-12-20 MED ORDER — ONDANSETRON HCL 4 MG/2ML IJ SOLN: 4.0000 mg | Freq: Four times a day (QID) | INTRAMUSCULAR | Status: DC | PRN

## 2014-12-20 MED ORDER — MISOPROSTOL 25 MCG QUARTER TABLET
25.0000 ug | ORAL_TABLET | ORAL | Status: DC | PRN
  Administered 2014-12-20: 25 ug via VAGINAL
  Filled 2014-12-20: qty 0.25
  Filled 2014-12-20: qty 1

## 2014-12-20 MED ORDER — TERBUTALINE SULFATE 1 MG/ML IJ SOLN: 0.2500 mg | Freq: Once | INTRAMUSCULAR | Status: AC | PRN

## 2014-12-20 NOTE — H&P (Signed)
Christy David is a 29 y.o. female [phone] at 915w3d by L/2 presenting for IOL 2/2 cholestasis of pregnancy. Maternal Medical History:  Fetal activity: Perceived fetal activity is normal.   Last perceived fetal movement was within the past hour.      OB History    Gravida Para Term Preterm AB TAB SAB Ectopic Multiple Living   4 3 3       3      Past Medical History  Diagnosis Date  . No pertinent past medical history    Past Surgical History  Procedure Laterality Date  . No past surgeries     Family History: family history includes Cancer in her sister; Diabetes in her mother; Kidney disease in her brother. There is no history of Anesthesia problems. Social History:  reports that she has never smoked. She has never used smokeless tobacco. She reports that she does not drink alcohol or use illicit drugs.   Prenatal Transfer Tool  Maternal Diabetes: No failed 1 hour, but passed 3 hours.  Genetic Screening: Normal Maternal Ultrasounds/Referrals: Normal Fetal Ultrasounds or other Referrals:  None Maternal Substance Abuse:  No Significant Maternal Medications:  None Significant Maternal Lab Results:  None Other Comments:  cholestasis of pregnancy   Review of Systems  All other systems reviewed and are negative.   Dilation: 1 Effacement (%): 50 Station: -3 Exam by:: Dr. Thressa ShellerHeather Talulah Schirmer Blood pressure 106/50, pulse 81, temperature 98.4 F (36.9 C), temperature source Oral, resp. rate 16, height 5\' 2"  (1.575 m), weight 91.173 kg (201 lb), last menstrual period 04/02/2014, unknown if currently breastfeeding. Maternal Exam:  Uterine Assessment: Contraction frequency is rare.   Abdomen: Patient reports no abdominal tenderness. Fetal presentation: vertex  Introitus: Normal vulva. Pelvis: adequate for delivery.   Cervix: Cervix evaluated by digital exam.     Fetal Exam Fetal Monitor Review: Mode: ultrasound.   Baseline rate: 130.  Variability: moderate (6-25 bpm).    Pattern: accelerations present and no decelerations.    Fetal State Assessment: Category I - tracings are normal.     Physical Exam  Nursing note and vitals reviewed. Constitutional: She is oriented to person, place, and time. She appears well-developed and well-nourished. No distress.  Cardiovascular: Normal rate.   Respiratory: Effort normal.  GI: Soft. There is no tenderness. There is no rebound.  Genitourinary:  1/50/-3  Neurological: She is alert and oriented to person, place, and time.  Skin: Skin is warm and dry.  Psychiatric: She has a normal mood and affect.    Prenatal labs: ABO, Rh: A/Positive/-- (11/12 0000) Antibody: Negative (11/12 0000) Rubella: Immune (11/12 0000) RPR: Nonreactive (11/12 0000)  HBsAg: Negative (11/12 0000)  HIV: Non-reactive (11/12 0000)  GBS: Positive (03/31 0000)   Assessment/Plan: Cholestasis of pregnancy at term IOL Admit to labor and delivery Cervical ripening    Tawnya CrookHogan, Ladawn Boullion Donovan 12/20/2014, 8:35 PM

## 2014-12-20 NOTE — Telephone Encounter (Signed)
Preadmission screen  

## 2014-12-21 ENCOUNTER — Inpatient Hospital Stay (HOSPITAL_COMMUNITY): Payer: Medicaid Other | Admitting: Anesthesiology

## 2014-12-21 ENCOUNTER — Encounter (HOSPITAL_COMMUNITY): Payer: Self-pay

## 2014-12-21 DIAGNOSIS — O2662 Liver and biliary tract disorders in childbirth: Secondary | ICD-10-CM

## 2014-12-21 DIAGNOSIS — K831 Obstruction of bile duct: Secondary | ICD-10-CM

## 2014-12-21 DIAGNOSIS — Z833 Family history of diabetes mellitus: Secondary | ICD-10-CM

## 2014-12-21 DIAGNOSIS — O99824 Streptococcus B carrier state complicating childbirth: Secondary | ICD-10-CM

## 2014-12-21 LAB — HIV ANTIBODY (ROUTINE TESTING W REFLEX): HIV Screen 4th Generation wRfx: NONREACTIVE

## 2014-12-21 LAB — RPR: RPR Ser Ql: NONREACTIVE

## 2014-12-21 MED ORDER — PENICILLIN G POTASSIUM 5000000 UNITS IJ SOLR
2.5000 10*6.[IU] | INTRAVENOUS | Status: DC
  Administered 2014-12-21 (×3): 2.5 10*6.[IU] via INTRAVENOUS
  Filled 2014-12-21 (×8): qty 2.5

## 2014-12-21 MED ORDER — PHENYLEPHRINE 40 MCG/ML (10ML) SYRINGE FOR IV PUSH (FOR BLOOD PRESSURE SUPPORT)
80.0000 ug | PREFILLED_SYRINGE | INTRAVENOUS | Status: DC | PRN
  Filled 2014-12-21: qty 2

## 2014-12-21 MED ORDER — WITCH HAZEL-GLYCERIN EX PADS: 1.0000 "application " | MEDICATED_PAD | CUTANEOUS | Status: DC | PRN

## 2014-12-21 MED ORDER — BENZOCAINE-MENTHOL 20-0.5 % EX AERO: 1.0000 "application " | INHALATION_SPRAY | CUTANEOUS | Status: DC | PRN

## 2014-12-21 MED ORDER — ONDANSETRON HCL 4 MG/2ML IJ SOLN: 4.0000 mg | INTRAMUSCULAR | Status: DC | PRN

## 2014-12-21 MED ORDER — TETANUS-DIPHTH-ACELL PERTUSSIS 5-2.5-18.5 LF-MCG/0.5 IM SUSP: 0.5000 mL | Freq: Once | INTRAMUSCULAR | Status: DC

## 2014-12-21 MED ORDER — LACTATED RINGERS IV SOLN: 500.0000 mL | Freq: Once | INTRAVENOUS | Status: DC

## 2014-12-21 MED ORDER — ACETAMINOPHEN 325 MG PO TABS
650.0000 mg | ORAL_TABLET | ORAL | Status: DC | PRN
Start: 2014-12-21 — End: 2014-12-23

## 2014-12-21 MED ORDER — LIDOCAINE HCL (PF) 1 % IJ SOLN
INTRAMUSCULAR | Status: DC | PRN
  Administered 2014-12-21: 6 mL
  Administered 2014-12-21: 4 mL

## 2014-12-21 MED ORDER — DIPHENHYDRAMINE HCL 25 MG PO CAPS: 25.0000 mg | ORAL_CAPSULE | Freq: Four times a day (QID) | ORAL | Status: DC | PRN

## 2014-12-21 MED ORDER — EPHEDRINE 5 MG/ML INJ
10.0000 mg | INTRAVENOUS | Status: DC | PRN
  Filled 2014-12-21: qty 2

## 2014-12-21 MED ORDER — DIBUCAINE 1 % RE OINT: 1.0000 "application " | TOPICAL_OINTMENT | RECTAL | Status: DC | PRN

## 2014-12-21 MED ORDER — NALBUPHINE HCL 10 MG/ML IJ SOLN
10.0000 mg | INTRAMUSCULAR | Status: DC | PRN
  Administered 2014-12-21 (×2): 10 mg via INTRAVENOUS
  Filled 2014-12-21 (×2): qty 1

## 2014-12-21 MED ORDER — OXYCODONE-ACETAMINOPHEN 5-325 MG PO TABS: 2.0000 | ORAL_TABLET | ORAL | Status: DC | PRN

## 2014-12-21 MED ORDER — PHENYLEPHRINE 40 MCG/ML (10ML) SYRINGE FOR IV PUSH (FOR BLOOD PRESSURE SUPPORT)
80.0000 ug | PREFILLED_SYRINGE | INTRAVENOUS | Status: DC | PRN
  Filled 2014-12-21: qty 20
  Filled 2014-12-21: qty 2

## 2014-12-21 MED ORDER — FENTANYL 2.5 MCG/ML BUPIVACAINE 1/10 % EPIDURAL INFUSION (WH - ANES)
14.0000 mL/h | INTRAMUSCULAR | Status: DC | PRN
  Administered 2014-12-21: 14 mL/h via EPIDURAL
  Filled 2014-12-21: qty 125

## 2014-12-21 MED ORDER — SENNOSIDES-DOCUSATE SODIUM 8.6-50 MG PO TABS
2.0000 | ORAL_TABLET | ORAL | Status: DC
  Administered 2014-12-21 – 2014-12-23 (×2): 2 via ORAL
  Filled 2014-12-21 (×2): qty 2

## 2014-12-21 MED ORDER — ONDANSETRON HCL 4 MG PO TABS: 4.0000 mg | ORAL_TABLET | ORAL | Status: DC | PRN

## 2014-12-21 MED ORDER — DIPHENHYDRAMINE HCL 50 MG/ML IJ SOLN: 12.5000 mg | INTRAMUSCULAR | Status: DC | PRN

## 2014-12-21 MED ORDER — LANOLIN HYDROUS EX OINT: TOPICAL_OINTMENT | CUTANEOUS | Status: DC | PRN

## 2014-12-21 MED ORDER — SIMETHICONE 80 MG PO CHEW
80.0000 mg | CHEWABLE_TABLET | ORAL | Status: DC | PRN
Start: 2014-12-21 — End: 2014-12-23

## 2014-12-21 MED ORDER — DEXTROSE 5 % IV SOLN
5.0000 10*6.[IU] | Freq: Once | INTRAVENOUS | Status: AC
  Administered 2014-12-21: 5 10*6.[IU] via INTRAVENOUS
  Filled 2014-12-21: qty 5

## 2014-12-21 MED ORDER — FENTANYL 2.5 MCG/ML BUPIVACAINE 1/10 % EPIDURAL INFUSION (WH - ANES): 14.0000 mL/h | INTRAMUSCULAR | Status: DC | PRN

## 2014-12-21 MED ORDER — ZOLPIDEM TARTRATE 5 MG PO TABS: 5.0000 mg | ORAL_TABLET | Freq: Every evening | ORAL | Status: DC | PRN

## 2014-12-21 MED ORDER — TERBUTALINE SULFATE 1 MG/ML IJ SOLN
0.2500 mg | Freq: Once | INTRAMUSCULAR | Status: DC | PRN
  Filled 2014-12-21: qty 1

## 2014-12-21 MED ORDER — OXYTOCIN 40 UNITS IN LACTATED RINGERS INFUSION - SIMPLE MED
1.0000 m[IU]/min | INTRAVENOUS | Status: DC
Start: 2014-12-21 — End: 2014-12-21
  Administered 2014-12-21: 1 m[IU]/min via INTRAVENOUS

## 2014-12-21 MED ORDER — OXYCODONE-ACETAMINOPHEN 5-325 MG PO TABS
1.0000 | ORAL_TABLET | ORAL | Status: DC | PRN
  Administered 2014-12-22 (×2): 1 via ORAL
  Filled 2014-12-21 (×2): qty 1

## 2014-12-21 MED ORDER — PRENATAL MULTIVITAMIN CH
1.0000 | ORAL_TABLET | Freq: Every day | ORAL | Status: DC
  Administered 2014-12-22 – 2014-12-23 (×2): 1 via ORAL
  Filled 2014-12-21 (×2): qty 1

## 2014-12-21 MED ORDER — IBUPROFEN 600 MG PO TABS
600.0000 mg | ORAL_TABLET | Freq: Four times a day (QID) | ORAL | Status: DC
  Administered 2014-12-21 – 2014-12-23 (×8): 600 mg via ORAL
  Filled 2014-12-21 (×8): qty 1

## 2014-12-21 NOTE — Anesthesia Preprocedure Evaluation (Signed)

## 2014-12-21 NOTE — Progress Notes (Signed)
Christy David is a 29 y.o. G4P3003 at 2954w4d by ultrasound admitted for induction of labor due to Cholestasis.  Subjective:   Objective: BP 118/71 mmHg  Pulse 72  Temp(Src) 98 F (36.7 C) (Oral)  Resp 20  Ht 5\' 2"  (1.575 m)  Wt 91.173 kg (201 lb)  BMI 36.75 kg/m2  LMP 04/02/2014      FHT:  FHR: 130 bpm, variability: moderate,  accelerations:  Present,  decelerations:  Absent UC:   regular, every 2 minutes SVE:   Dilation: 2.5 Effacement (%): 50 Station: -2 Exam by:: L. Clemmons, CNM  Labs: Lab Results  Component Value Date   WBC 7.6 12/20/2014   HGB 11.1* 12/20/2014   HCT 33.5* 12/20/2014   MCV 75.5* 12/20/2014   PLT 167 12/20/2014    Assessment / Plan: AROM clear Fluid; Pitocin @ 4114mu/min  Labor: Progressing normally Preeclampsia:  n/a Fetal Wellbeing:  Category I Pain Control:  nubain I/D:  n/a Anticipated MOD:  NSVD  Clemmons,Lori Grissett 12/21/2014, 2:10 PM

## 2014-12-21 NOTE — Anesthesia Procedure Notes (Signed)

## 2014-12-21 NOTE — Progress Notes (Signed)
Christy David is a 29 y.o. 21[name] at 6736w4d  admitted for induction of labor due to cholestasis.  Subjective: No complaints. Starting to feel uncomfortable with contractions. Rating pain 7/10.   Objective: BP 110/65 mmHg  Pulse 72  Temp(Src) 97.7 F (36.5 C) (Oral)  Resp 18  Ht 5\' 2"  (1.575 m)  Wt 91.173 kg (201 lb)  BMI 36.75 kg/m2  LMP 04/02/2014      FHT:  FHR: 125 bpm, variability: moderate,  accelerations:  Present,  decelerations:  Absent UC:   regular, every 3-4 minutes SVE:   Dilation: 2 Effacement (%): 50 Station: -3 Exam by:: L.Stubbs, RN  Labs: Lab Results  Component Value Date   WBC 7.6 12/20/2014   HGB 11.1* 12/20/2014   HCT 33.5* 12/20/2014   MCV 75.5* 12/20/2014   PLT 167 12/20/2014    Assessment / Plan: Induction of labor due to cholestasis ,  progressing well on pitocin  Labor: progressing normally for latent phase  Preeclampsia:  NA Fetal Wellbeing:  Category I Pain Control:  Labor support without medications I/D:  n/a Anticipated MOD:  NSVD  Tawnya CrookHogan, Heather Donovan 12/21/2014, 7:37 AM

## 2014-12-22 LAB — CBC
HCT: 28.3 % — ABNORMAL LOW (ref 36.0–46.0)
Hemoglobin: 9.3 g/dL — ABNORMAL LOW (ref 12.0–15.0)
MCH: 24.9 pg — AB (ref 26.0–34.0)
MCHC: 32.9 g/dL (ref 30.0–36.0)
MCV: 75.9 fL — ABNORMAL LOW (ref 78.0–100.0)
Platelets: 154 10*3/uL (ref 150–400)
RBC: 3.73 MIL/uL — AB (ref 3.87–5.11)
RDW: 13.5 % (ref 11.5–15.5)
WBC: 11.4 10*3/uL — ABNORMAL HIGH (ref 4.0–10.5)

## 2014-12-22 NOTE — Anesthesia Postprocedure Evaluation (Signed)
Anesthesia Post Note  Patient: Christy David  Procedure(s) Performed: * No procedures listed *  Anesthesia type: Epidural  Patient location: Mother/Baby  Post pain: Pain level controlled  Post assessment: Post-op Vital signs reviewed  Last Vitals:  Filed Vitals:   12/22/14 0520  BP: 113/65  Pulse: 56  Temp: 36.5 C  Resp: 18    Post vital signs: Reviewed  Level of consciousness:alert  Complications: No apparent anesthesia complications

## 2014-12-22 NOTE — Progress Notes (Signed)
Post Partum Day 1 Subjective:  Jethro Bastosetra Suarez-Hernandez is a 29 y.o. [id number] 5348w4d s/p NSVD.  No acute events overnight.  Pt denies problems with ambulating, voiding or po intake.  She denies nausea or vomiting.  Pain is well controlled.  She has not had flatus. She has not had bowel movement.  Lochia Minimal.  Plan for birth control is Nexplanon.  Method of Feeding: Bottle  Objective: Blood pressure 113/65, pulse 56, temperature 97.7 F (36.5 C), temperature source Oral, resp. rate 18, height 5\' 2"  (1.575 m), weight 91.173 kg (201 lb), last menstrual period 04/02/2014, SpO2 99 %, not currently breastfeeding.  Physical Exam:  General: alert, cooperative and no distress Lochia:normal flow, minimal, less than last night  Chest: CTAB, no increased work of breathing Heart: RRR no m/r/g, normal S1/S2 Abdomen: +BS, soft, nontender,  Uterine Fundus: firm, centrally located DVT Evaluation: No evidence of DVT seen on physical exam. Extremities: trace edema   Recent Labs  12/20/14 2036 12/22/14 0603  HGB 11.1* 9.3*  HCT 33.5* 28.3*    Assessment/Plan:  ASSESSMENT: Jethro Bastosetra Suarez-Hernandez is a 29 y.o. [id number] 148w4d s/p NSVD  Discharge home unless pediatrics team wants to observe child, will consult with mother later today   LOS: 2 days   Therese SarahKevin A Applegate, Medical Student 12/22/2014, 7:41 AM   CNM attestation:  I have seen and examined this patient; I agree with above documentation in the medical student's note.   Jethro Bastosetra Suarez-Hernandez is a 29 y.o. [id number] s/p SVD. She is bottlefeeding and desires Nexplanon for contraception.  PE: BP 113/65 mmHg  Pulse 56  Temp(Src) 97.7 F (36.5 C) (Oral)  Resp 18  Ht 5\' 2"  (1.575 m)  Wt 91.173 kg (201 lb)  BMI 36.75 kg/m2  SpO2 99%  LMP 04/02/2014  Breastfeeding? Unknown Gen: calm comfortable, NAD Resp: normal effort, no distress Abd: uterus firm Pelvic: lochia nl No s/s DVT   Plan: Anticipate that pt will be here until tomorrow  due to infant obs w/ mother being GBS pos, but would be willing to d/c pt later this afternoon if peds will allow baby to go.  Cam HaiSHAW, KIMBERLY, CNM 8:56 AM

## 2014-12-23 MED ORDER — IBUPROFEN 600 MG PO TABS: 600.0000 mg | ORAL_TABLET | Freq: Four times a day (QID) | ORAL | Status: AC

## 2014-12-23 MED ORDER — OXYCODONE-ACETAMINOPHEN 5-325 MG PO TABS: 1.0000 | ORAL_TABLET | ORAL | Status: AC | PRN

## 2014-12-23 NOTE — Discharge Summary (Signed)
Obstetric Discharge Summary Reason for Admission: induction of labor Prenatal Procedures: ultrasound Intrapartum Procedures: spontaneous vaginal delivery Postpartum Procedures: none Complications-Operative and Postpartum: none HEMOGLOBIN  Date Value Ref Range Status  12/22/2014 9.3* 12.0 - 15.0 g/dL Final   HCT  Date Value Ref Range Status  12/22/2014 28.3* 36.0 - 46.0 % Final    Physical Exam:  General: alert, cooperative, appears stated age and no distress Lochia: appropriate Uterine Fundus: firm Incision: n/a DVT Evaluation: No evidence of DVT seen on physical exam. Negative Homan's sign. No cords or calf tenderness. No significant calf/ankle edema.  Discharge Diagnoses: Term Pregnancy-delivered  Discharge Information: Date: 12/23/2014 Activity: pelvic rest Diet: routine Medications: PNV, Ibuprofen and Percocet Condition: stable and improved Instructions: refer to practice specific booklet Discharge to: home   Newborn Data: Live born female  Birth Weight: 7 lb 7.2 oz (3379 g) APGAR: 9, 9  Home with mother.  Christy David, Christy David 12/23/2014, 8:35 AM

## 2014-12-24 NOTE — Progress Notes (Signed)
Post discharge ur review completed. 

## 2015-11-04 IMAGING — US US OB DETAIL+14 WK
2 series · 12 of 28 positions shown · non-contrast
Comparison: none

[Series 1: us ob detail +14 wk · 65 acquisitions, 9 frames shown (1 of 2)]
[im 4/65]
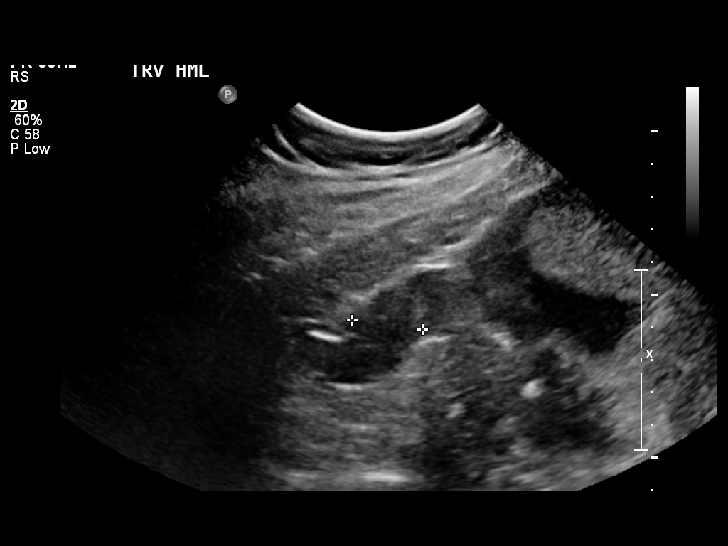
[im 10/65]
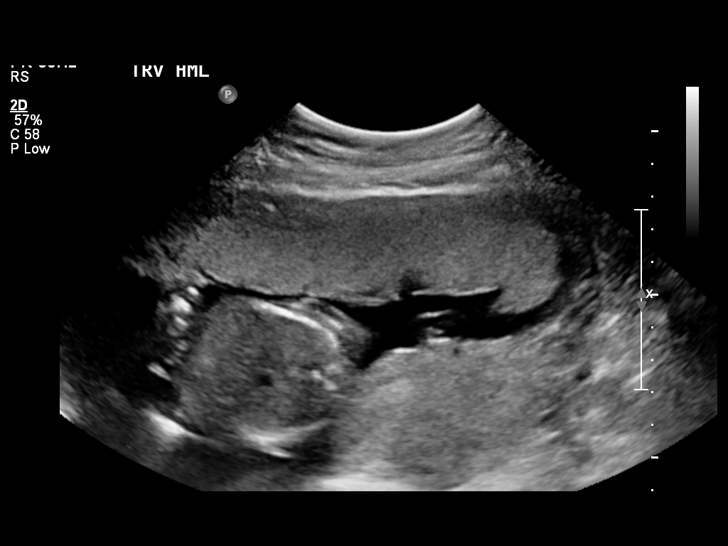
[im 16/65]
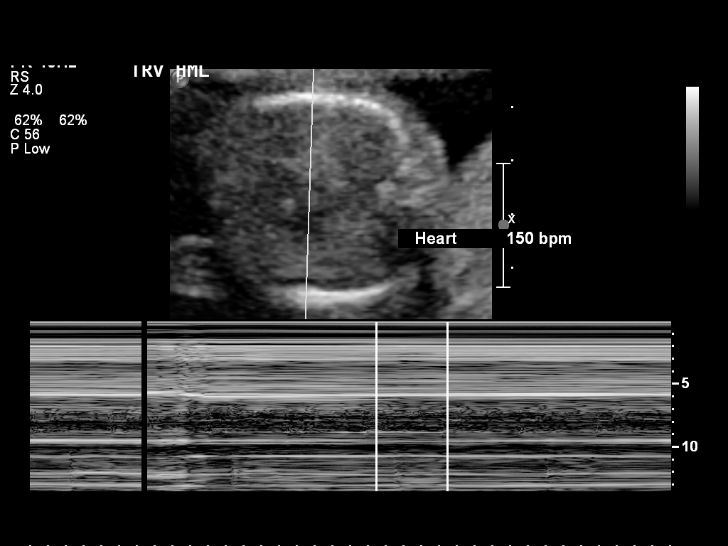
[im 25/65]
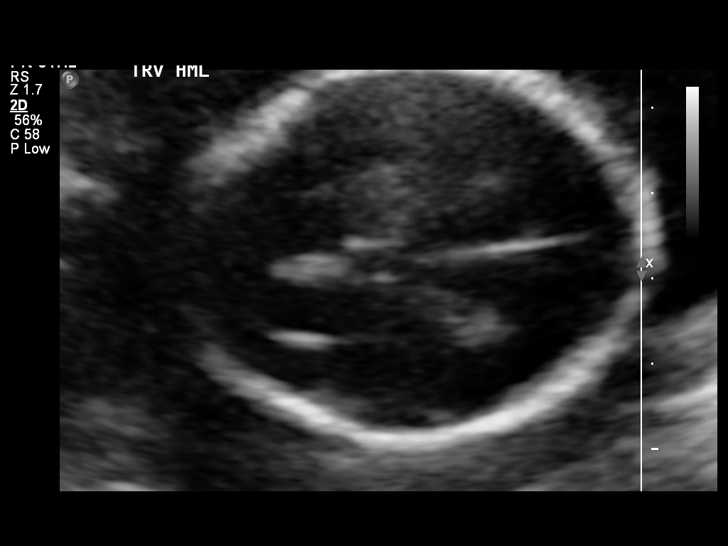
[im 31/65]
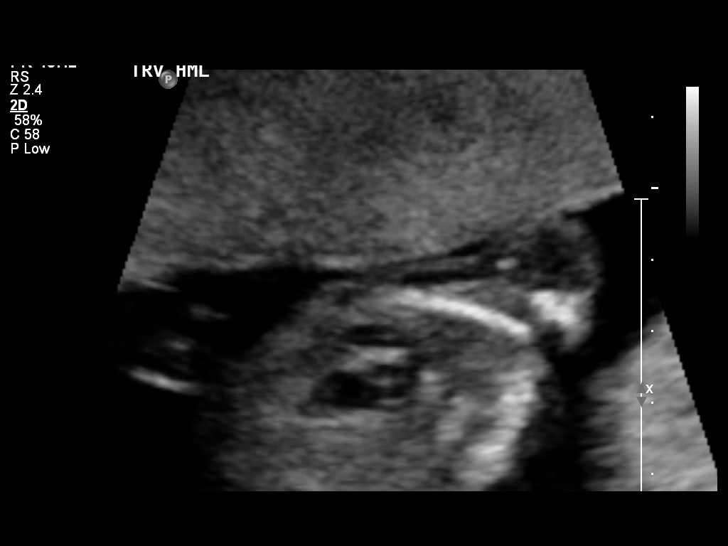
[im 37/65]
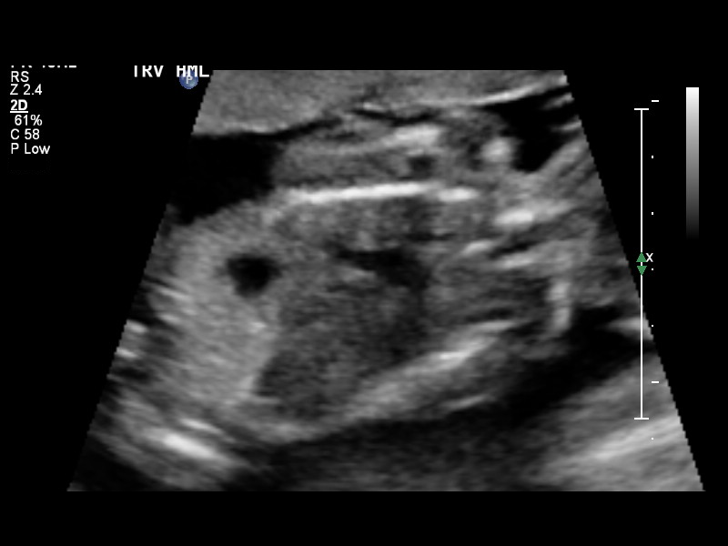
[im 46/65]
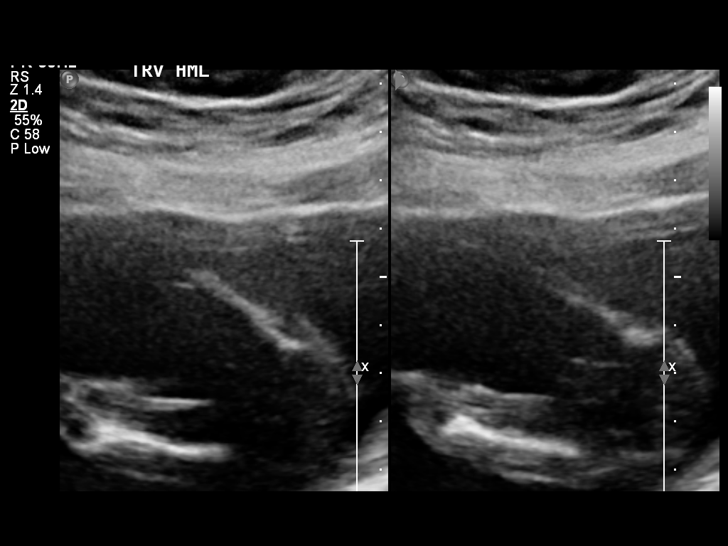
[im 52/65]
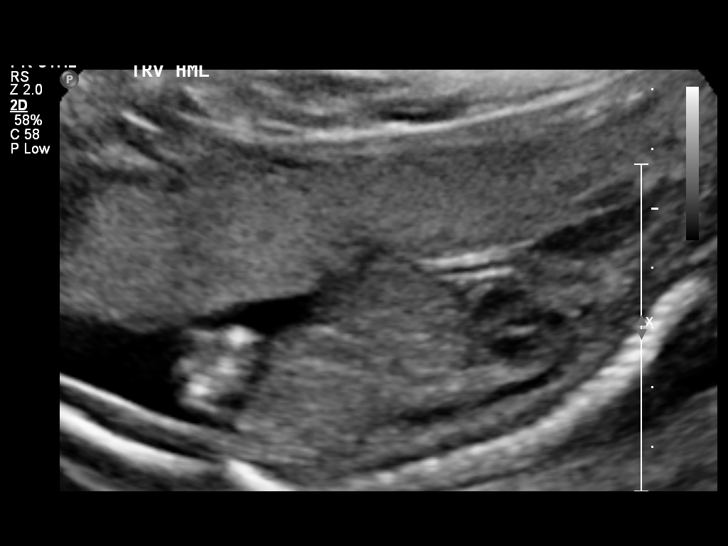
[im 58/65]
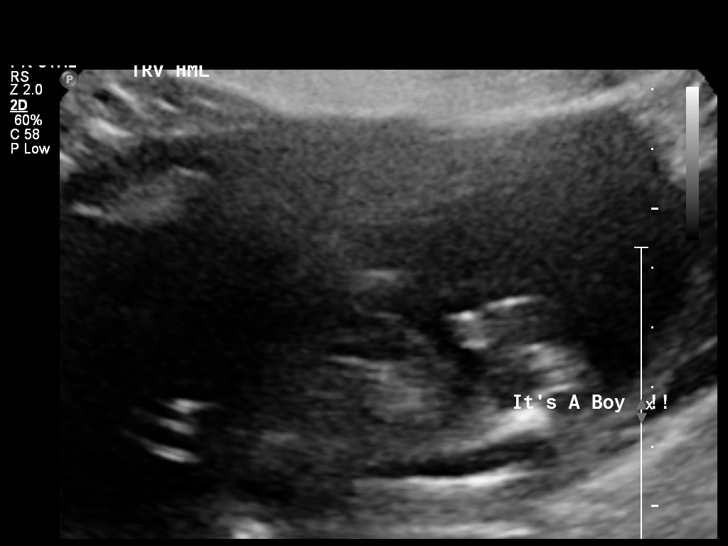

[Series 1: us ob detail +14 wk · 3 of 17 slices shown (2 of 2)]
[im 1/17]
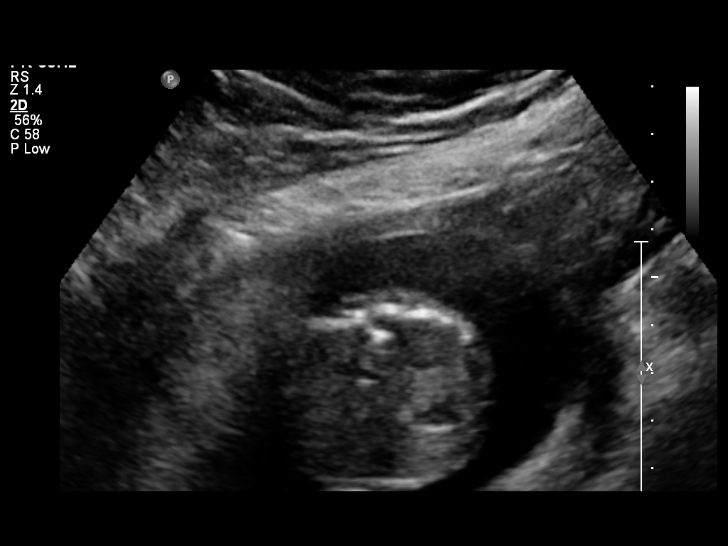
[im 7/17]
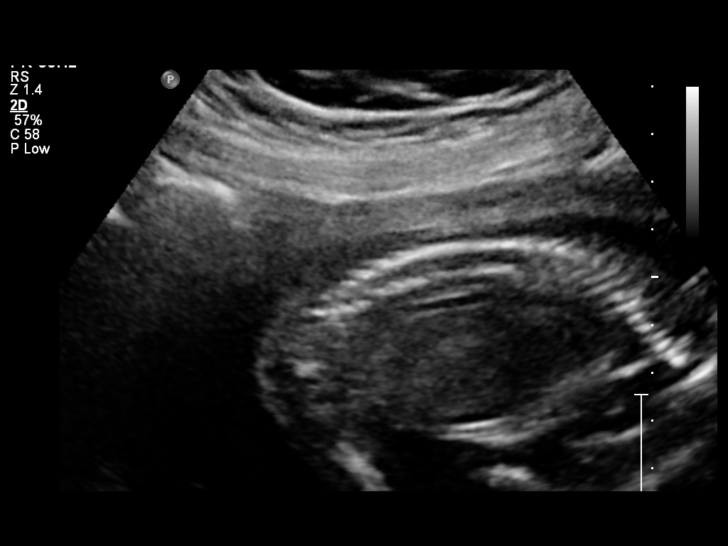
[im 13/17]
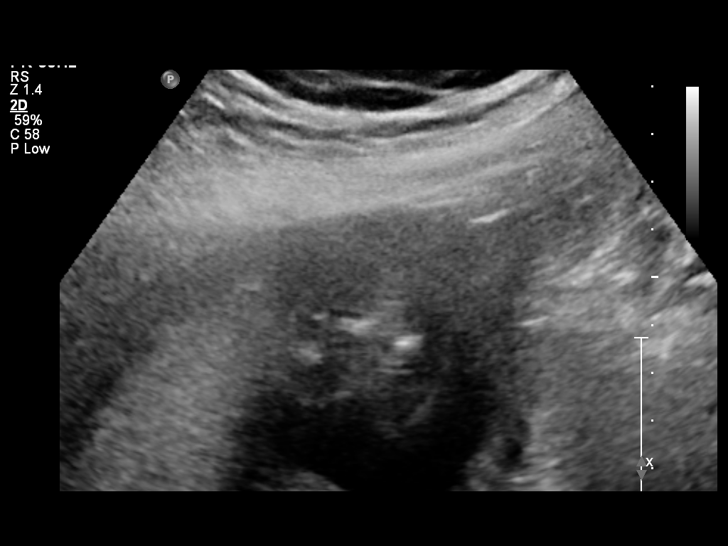

[12 of 28 positions shown; findings below may reference images not displayed]

OBSTETRICS REPORT
                      (Signed Final 08/14/2014 [DATE])

             [REDACTED] Health-
                                                         Faculty Physician
Service(s) Provided

 US OB DETAIL + 14 WK                                  76811.0
Indications

 19 weeks gestation of pregnancy
 Detailed fetal anatomic survey                        Z36
 Maternal morbid obesity
Fetal Evaluation

 Num Of Fetuses:    1
 Fetal Heart Rate:  150                          bpm
 Cardiac Activity:  Observed
 Presentation:      Variable
 Placenta:          Anterior, above cervical os
 P. Cord            Not well visualized
 Insertion:

 Amniotic Fluid
 AFI FV:      Subjectively within normal limits
                                             Larg Pckt:     6.6  cm
Biometry

 BPD:     45.5  mm     G. Age:  19w 5d                CI:        74.59   70 - 86
                                                      FL/HC:      18.5   16.1 -

 HC:     167.2  mm     G. Age:  19w 3d       55  %    HC/AC:      1.09   1.09 -

 AC:       153  mm     G. Age:  20w 4d       85  %    FL/BPD:
 FL:        31  mm     G. Age:  19w 4d       60  %    FL/AC:      20.3   20 - 24
 NFT:     3.69  mm

 Est. FW:     327  gm    0 lb 12 oz      58  %
Gestational Age

 LMP:           19w 1d        Date:  04/02/14                 EDD:   01/07/15
 U/S Today:     19w 6d                                        EDD:   01/02/15
 Best:          19w 1d     Det. By:  LMP  (04/02/14)          EDD:   01/07/15
Anatomy
 Cranium:          Appears normal         Aortic Arch:      Appears normal
 Fetal Cavum:      Appears normal         Ductal Arch:      Appears normal
 Ventricles:       Appears normal         Diaphragm:        Appears normal
 Choroid Plexus:   Appears normal         Stomach:          Appears normal, left
                                                            sided
 Cerebellum:       Appears normal         Abdomen:          Appears normal
 Posterior Fossa:  Appears normal         Abdominal Wall:   Appears nml (cord
                                                            insert, abd wall)
 Nuchal Fold:      Appears normal         Cord Vessels:     Appears normal (3
                                                            vessel cord)
 Face:             Orbits nl; profile not Kidneys:          Appear normal
                   well visualized
 Lips:             Not well visualized    Bladder:          Appears normal
 Heart:            Not well visualized    Spine:            Not well visualized
 RVOT:             Appears normal         Lower             Appears normal
                                          Extremities:
 LVOT:             Appears normal         Upper             Appears normal
                                          Extremities:

 Other:  Male gender. Heels visualized. Technically difficult due to maternal
         habitus and fetal position.
Targeted Anatomy

 Fetal Central Nervous System
 Cisterna Magna:
Cervix Uterus Adnexa

 Cervical Length:    3.3      cm

 Cervix:       Normal appearance by transabdominal scan.
 Left Ovary:    Not visualized. No adnexal mass visualized.
 Right Ovary:   Size(cm) L: 2.5 x W: 2.16 x H: 1.64  Volume(cc):
Comments

 The patient's fetal anatomic survey is not complete.
 However, no gross fetal anomalies were identified.
Impression

 Single living intrauterine pregnancy at 19 weeks 1 day.
 Appropriate fetal growth (58%).
 Normal amniotic fluid volume.
 No gross fetal anomalies identified.
Recommendations

 Recommend follow-up ultrasound examination in 4-6 weeks
 to reassess fetal growth and anatomy.

                TR

## 2015-12-31 IMAGING — US US OB FOLLOW-UP
1 series · 12 of 28 positions shown · non-contrast
Comparison: none

[Series 1: us ob follow up · 12 of 54 slices shown]
[im 2/54]
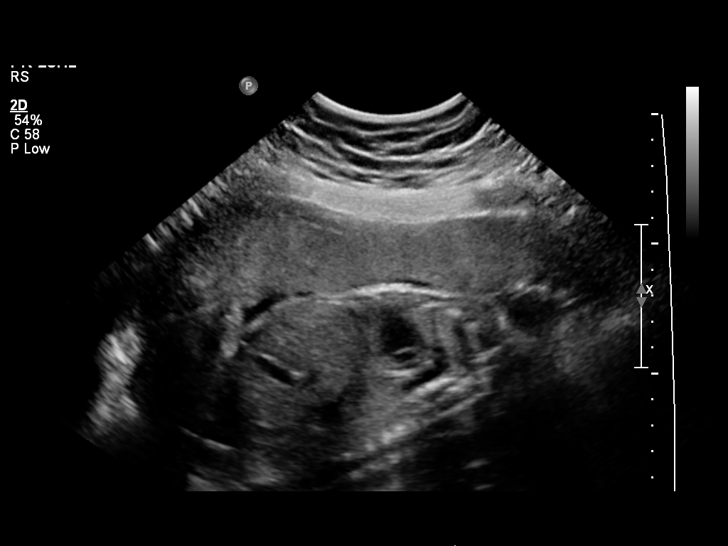
[im 6/54]
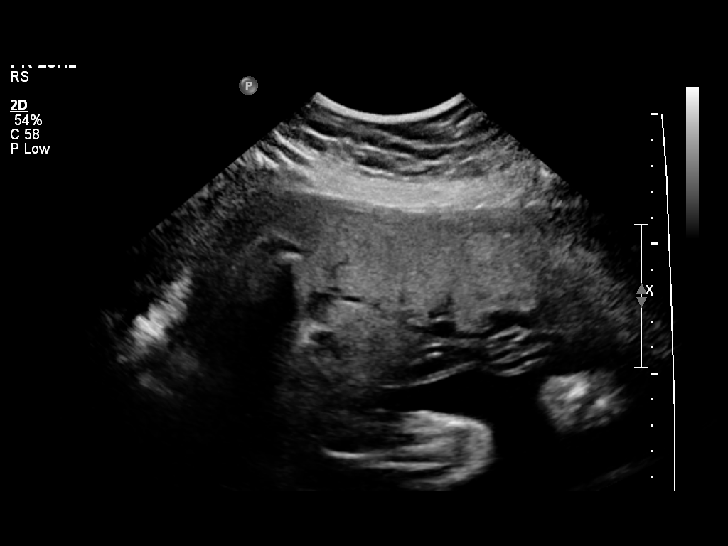
[im 10/54]
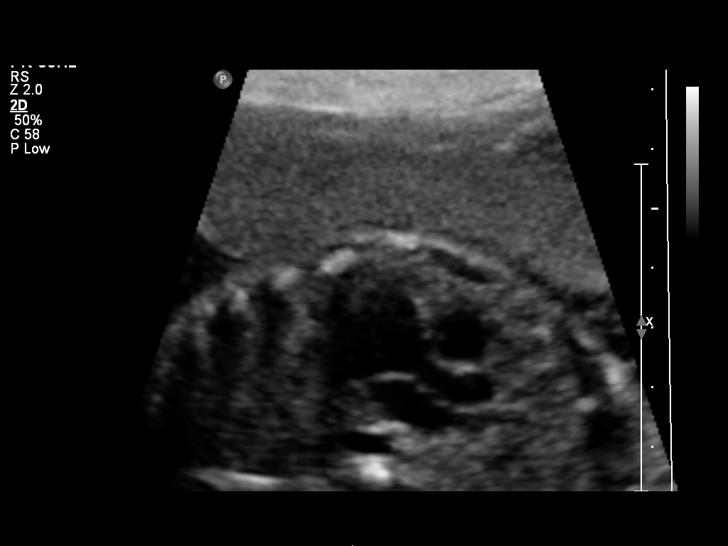
[im 16/54]
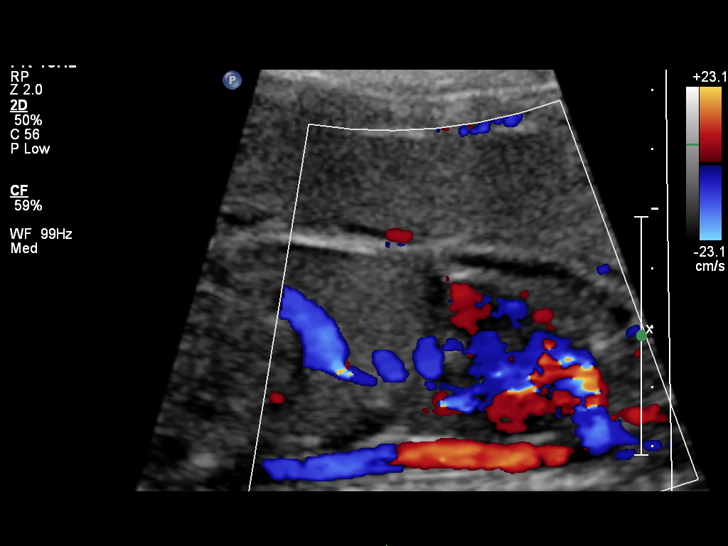
[im 20/54]
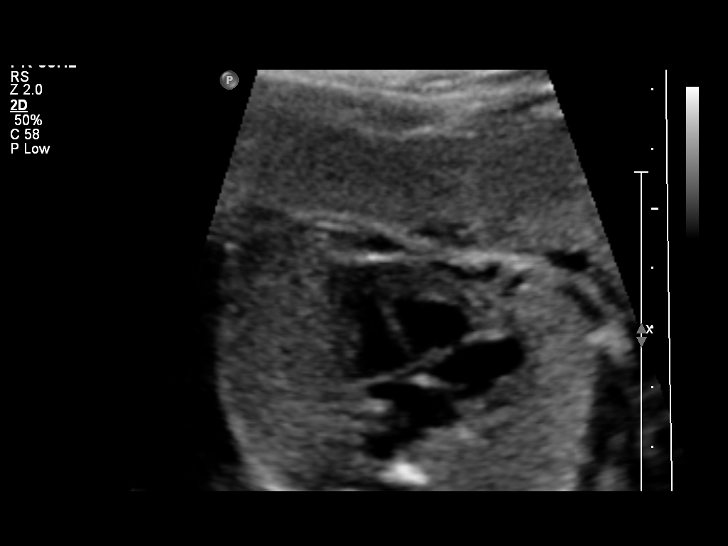
[im 24/54]
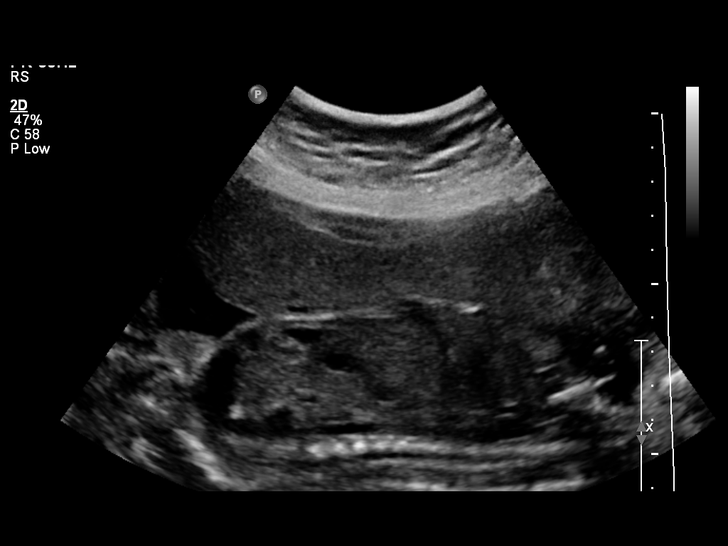
[im 30/54]
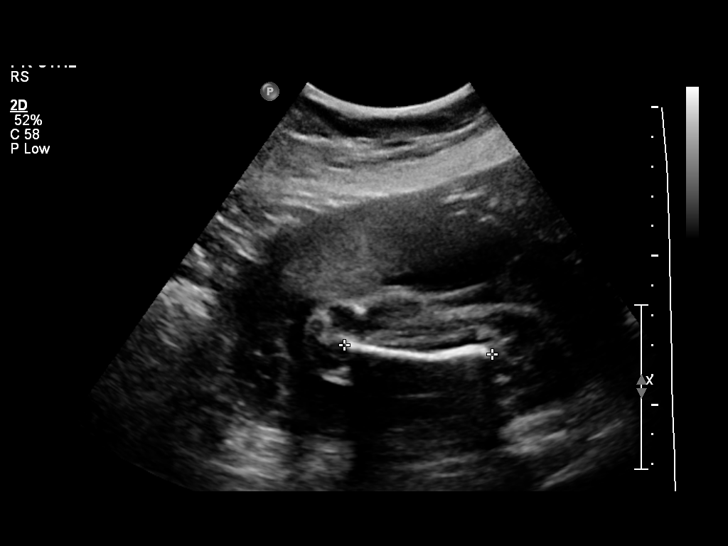
[im 34/54]
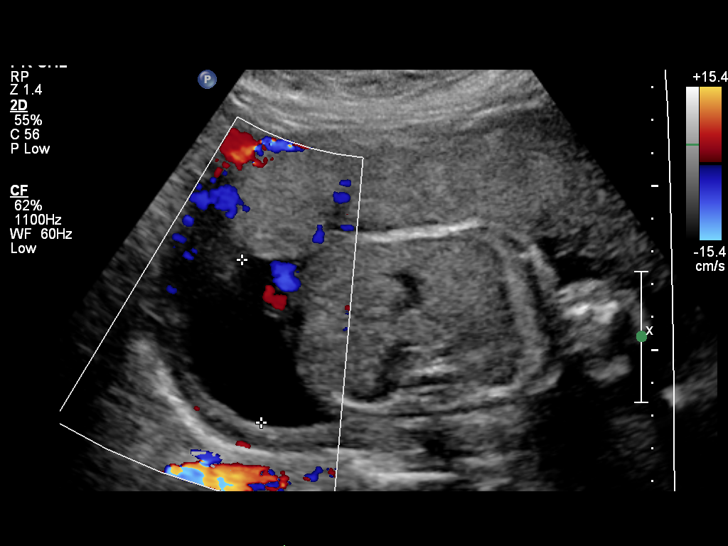
[im 38/54]
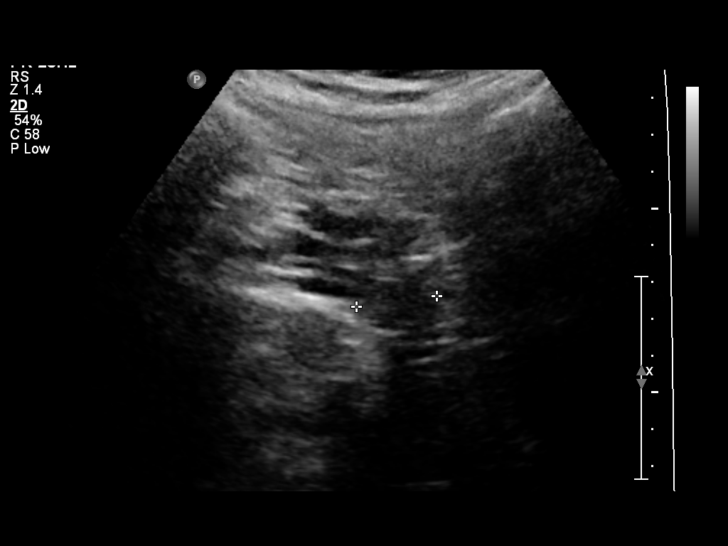
[im 44/54]
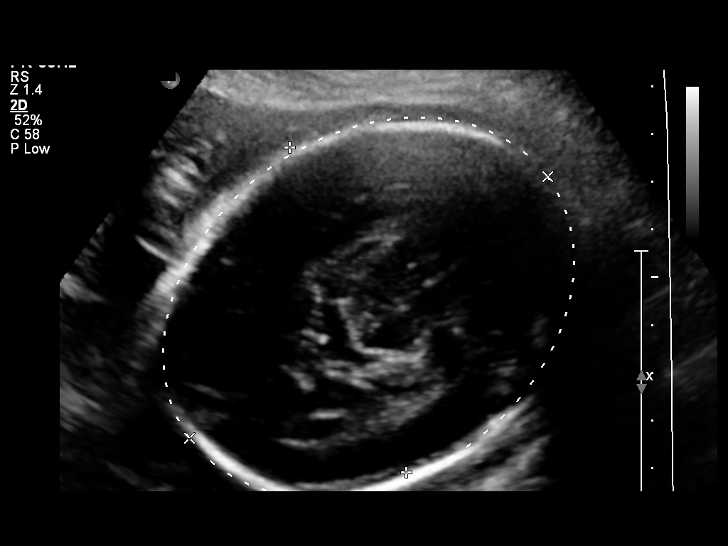
[im 48/54]
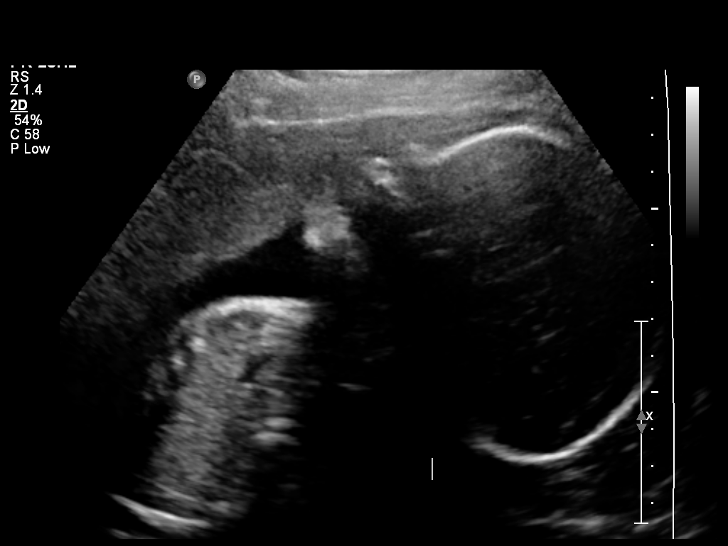
[im 52/54]
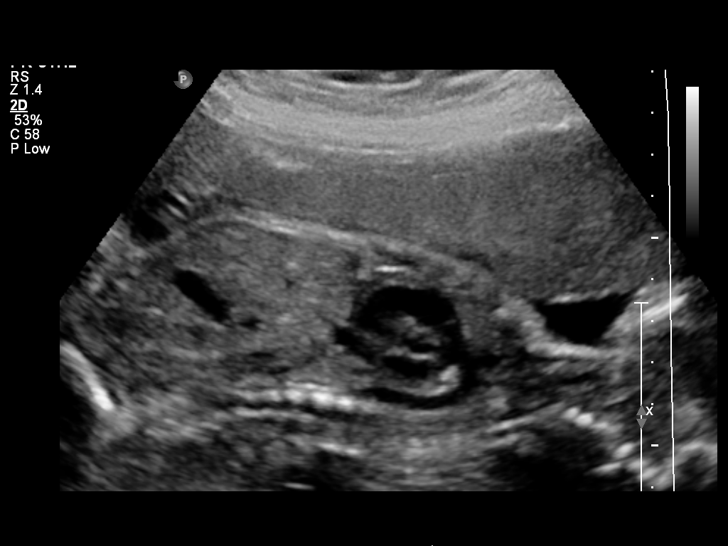

[12 of 28 positions shown; findings below may reference images not displayed]

OBSTETRICS REPORT
                      (Signed Final 10/10/2014 [DATE])

             ZABINI

Service(s) Provided

 US OB FOLLOW UP                                       76816.1
Indications

 27 weeks gestation of pregnancy
 Follow-up incomplete fetal anatomic evaluation        Z36
Fetal Evaluation

 Num Of Fetuses:    1
 Fetal Heart Rate:  144                          bpm
 Cardiac Activity:  Observed
 Presentation:      Cephalic
 Placenta:          Anterior, above cervical os
 P. Cord            Previously Visualized
 Insertion:

 Amniotic Fluid
 AFI FV:      Subjectively within normal limits
                                             Larg Pckt:     5.0  cm
Biometry

 BPD:     71.1  mm     G. Age:  28w 4d                CI:        74.83   70 - 86
                                                      FL/HC:      19.2   18.6 -

 HC:     260.8  mm     G. Age:  28w 3d       56  %    HC/AC:      1.16   1.05 -

 AC:     225.4  mm     G. Age:  27w 0d       32  %    FL/BPD:     70.3   71 - 87
 FL:        50  mm     G. Age:  26w 6d       24  %    FL/AC:      22.2   20 - 24

 Est. FW:    7138  gm      2 lb 5 oz     50  %
Gestational Age

 LMP:           27w 2d        Date:  04/02/14                 EDD:   01/07/15
 U/S Today:     27w 5d                                        EDD:   01/04/15
 Best:          27w 2d     Det. By:  LMP  (04/02/14)          EDD:   01/07/15
Anatomy

 Cranium:          Appears normal         Aortic Arch:      Appears normal
 Fetal Cavum:      Appears normal         Ductal Arch:      Appears normal
 Ventricles:       Appears normal         Diaphragm:        Appears normal
 Choroid Plexus:   Previously seen        Stomach:          Appears normal, left
                                                            sided
 Cerebellum:       Previously seen        Abdomen:          Appears normal
 Posterior Fossa:  Previously seen        Abdominal Wall:   Previously seen
 Nuchal Fold:      Previously seen        Cord Vessels:     Appears normal (3
                                                            vessel cord)
 Face:             Orbits nl; profile not Kidneys:          Appear normal
                   well visualized
 Lips:             Appears normal         Bladder:          Appears normal
 Heart:            Appears normal         Spine:            Not well visualized
                   (4CH, axis, and
                   situs)
 RVOT:             Appears normal         Lower             Appears normal
                                          Extremities:
 LVOT:             Appears normal         Upper             Appears normal
                                          Extremities:

 Other:  NB visualized. Male gender. Heels prev.visualized. Technically
         difficult due to maternal habitus and fetal position.
Cervix Uterus Adnexa

 Cervical Length:    3.6      cm

 Cervix:       Normal appearance by transabdominal scan.

 Left Ovary:    Size(cm) L: 2.44 x W: 2.2 x H: 1.72  Volume(cc):
 Right Ovary:   Size(cm) L: 2.87 x W: 2.25 x H: 2.48  Volume(cc):
Impression

 Single IUP at 27w 2d
 Limited views of the fetal spine obtained due to fetal position.
 The remainder of the fetal anatomy is within normal limits
 Fetal growth is appropriate (50th %tile)
 Anterior placenta without previa
 Normal amniotic fluid volume
Recommendations

 Follow-up ultrasounds as clinically indicated.

## 2021-04-03 ENCOUNTER — Emergency Department (HOSPITAL_COMMUNITY)
Admission: EM | Admit: 2021-04-03 | Discharge: 2021-04-04 | Disposition: A | Discharge status: Home / Self Care | Payer: Self-pay | Attending: Emergency Medicine | Admitting: Emergency Medicine

## 2021-04-03 DIAGNOSIS — K8020 Calculus of gallbladder without cholecystitis without obstruction: Secondary | ICD-10-CM

## 2021-04-03 LAB — URINALYSIS, ROUTINE W REFLEX MICROSCOPIC
Bilirubin Urine: NEGATIVE
Glucose, UA: NEGATIVE mg/dL
Hgb urine dipstick: NEGATIVE
Ketones, ur: NEGATIVE mg/dL
Leukocytes,Ua: NEGATIVE
Nitrite: NEGATIVE
Protein, ur: NEGATIVE mg/dL
Specific Gravity, Urine: 1.01 (ref 1.005–1.030)
pH: 7 (ref 5.0–8.0)

## 2021-04-03 LAB — I-STAT BETA HCG BLOOD, ED (MC, WL, AP ONLY): I-stat hCG, quantitative: <5 m[IU]/mL (ref <5)

## 2021-04-03 NOTE — ED Triage Notes (Signed)
Pt c/o epigastric pain x3 days, worsening since, states OTC meds haven't worked. 2 episodes of emesis/bile, states pain/discomfort worsened when she eats.

## 2021-04-04 ENCOUNTER — Other Ambulatory Visit: Payer: Self-pay

## 2021-04-04 ENCOUNTER — Emergency Department (HOSPITAL_COMMUNITY): Payer: Self-pay

## 2021-04-04 ENCOUNTER — Ambulatory Visit (HOSPITAL_COMMUNITY): Payer: Self-pay

## 2021-04-04 LAB — COMPREHENSIVE METABOLIC PANEL
ALT: 26 U/L (ref 0–44)
AST: 23 U/L (ref 15–41)
Albumin: 3.8 g/dL (ref 3.5–5.0)
Alkaline Phosphatase: 59 U/L (ref 38–126)
Anion gap: 10 (ref 5–15)
BUN: 14 mg/dL (ref 6–20)
CO2: 26 mmol/L (ref 22–32)
Calcium: 9.9 mg/dL (ref 8.9–10.3)
Chloride: 103 mmol/L (ref 98–111)
Creatinine, Ser: 1.57 mg/dL — ABNORMAL HIGH (ref 0.44–1.00)
GFR, Estimated: 45 mL/min — ABNORMAL LOW (ref >60)
Glucose, Bld: 111 mg/dL — ABNORMAL HIGH (ref 70–99)
Potassium: 4.1 mmol/L (ref 3.5–5.1)
Sodium: 139 mmol/L (ref 135–145)
Total Bilirubin: 0.5 mg/dL (ref 0.3–1.2)
Total Protein: 7.1 g/dL (ref 6.5–8.1)

## 2021-04-04 LAB — CBC
HCT: 40.1 % (ref 36.0–46.0)
Hemoglobin: 13 g/dL (ref 12.0–15.0)
MCH: 27 pg (ref 26.0–34.0)
MCHC: 32.4 g/dL (ref 30.0–36.0)
MCV: 83.4 fL (ref 80.0–100.0)
Platelets: 246 10*3/uL (ref 150–400)
RBC: 4.81 MIL/uL (ref 3.87–5.11)
RDW: 12.7 % (ref 11.5–15.5)
WBC: 10.5 10*3/uL (ref 4.0–10.5)
nRBC: 0 % (ref 0.0–0.2)

## 2021-04-04 LAB — LIPASE, BLOOD: Lipase: 29 U/L (ref 11–51)

## 2021-04-04 MED ORDER — HYDROMORPHONE HCL 1 MG/ML IJ SOLN
1.0000 mg | Freq: Once | INTRAMUSCULAR | Status: AC
Start: 2021-04-04 — End: 2021-04-04
  Administered 2021-04-04: 1 mg via INTRAVENOUS
  Filled 2021-04-04: qty 1

## 2021-04-04 MED ORDER — SODIUM CHLORIDE 0.9 % IV BOLUS
1000.0000 mL | Freq: Once | INTRAVENOUS | Status: AC
  Administered 2021-04-04: 1000 mL via INTRAVENOUS

## 2021-04-04 MED ORDER — FAMOTIDINE IN NACL 20-0.9 MG/50ML-% IV SOLN
20.0000 mg | Freq: Once | INTRAVENOUS | Status: AC
  Administered 2021-04-04: 20 mg via INTRAVENOUS
  Filled 2021-04-04: qty 50

## 2021-04-04 MED ORDER — FAMOTIDINE 20 MG PO TABS: 20.0000 mg | ORAL_TABLET | Freq: Two times a day (BID) | ORAL | 0 refills | Status: AC

## 2021-04-04 MED ORDER — HYDROCODONE-ACETAMINOPHEN 5-325 MG PO TABS: 1.0000 | ORAL_TABLET | ORAL | 0 refills | Status: AC | PRN

## 2021-04-04 MED ORDER — ONDANSETRON HCL 4 MG/2ML IJ SOLN
4.0000 mg | Freq: Once | INTRAMUSCULAR | Status: AC
  Administered 2021-04-04: 4 mg via INTRAVENOUS
  Filled 2021-04-04: qty 2

## 2021-04-04 NOTE — ED Notes (Addendum)
1:12 AM Pt had intermittent epigastric pain starting Monday, but has turned into constant pain. Says now she hasn't been able to eat or drink for the last 12 hours and when she does the pain worsens and caused her to throw up. Denies gallbladder problems. Has tried tums and zantac without relief.  1:32 AM Pt to [place] via stretcher.

## 2021-04-04 NOTE — ED Provider Notes (Signed)
[facility] EMERGENCY DEPARTMENT Provider Note   CSN: [id number] Arrival date & time: 04/03/21  2216     History Chief Complaint  Patient presents with   Abdominal Pain    Christy David is a 35 y.o. female.  Patient presents to the emergency department for evaluation of abdominal pain.  Patient reports that symptoms began 3 days ago.  At that time she was experiencing upper central abdominal pain mostly associated with eating.  Since then, symptoms have worsened.  She is now experiencing persistent pain throughout the day today associated with nausea and vomiting.  No hematemesis or melena.      Past Medical History:  Diagnosis Date   Medical history non-contributory    No pertinent past medical history     Patient Active Problem List   Diagnosis Date Noted   Cholestasis of pregnancy, antepartum 12/20/2014   Evaluate anatomy not seen on prior sonogram    [redacted] weeks gestation of pregnancy    Encounter for fetal anatomic survey    Maternal morbid obesity, antepartum (HCC)    [redacted] weeks gestation of pregnancy     Past Surgical History:  Procedure Laterality Date   NO PAST SURGERIES       OB History     Gravida  4   Para  4   Term  4   Preterm      AB      Living  4      SAB      IAB      Ectopic      Multiple  0   Live Births  4           Family History  Problem Relation Age of Onset   Anesthesia problems Neg Hx    Diabetes Mother    Cancer Sister        cervix age 1   Kidney disease Brother        as a child OK now    Social History   Tobacco Use   Smoking status: Never   Smokeless tobacco: Never  Substance Use Topics   Alcohol use: No   Drug use: No    Home Medications Prior to Admission medications   Medication Sig Start Date End Date Taking? Authorizing Provider  famotidine (PEPCID) 20 MG tablet Take 1 tablet (20 mg total) by mouth 2 (two) times daily. 04/04/21  Yes Ka Flammer, Canary Brim, MD   HYDROcodone-acetaminophen (NORCO/VICODIN) 5-325 MG tablet Take 1 tablet by mouth every 4 (four) hours as needed for moderate pain. 04/04/21  Yes Mossie Gilder, Canary Brim, MD  ibuprofen (ADVIL,MOTRIN) 600 MG tablet Take 1 tablet (600 mg total) by mouth every 6 (six) hours. 12/23/14   Montez Morita, CNM  oxyCODONE-acetaminophen (PERCOCET/ROXICET) 5-325 MG per tablet Take 1 tablet by mouth every 4 (four) hours as needed (for pain scale 4-7). 12/23/14   Montez Morita, CNM  Prenatal Vit-Fe Fumarate-FA (PRENATAL MULTIVITAMIN) TABS tablet Take 1 tablet by mouth daily at 12 noon.    [provider]    Allergies    Patient has no known allergies.  Review of Systems   Review of Systems  Constitutional:  Negative for fever.  Gastrointestinal:  Positive for abdominal pain, nausea and vomiting.  All other systems reviewed and are negative.  Physical Exam Updated Vital Signs BP 119/69 (BP Location: Left Arm)   Pulse (!) 58   Temp 98.3 F (36.8 C) (Oral)   Resp 16  Ht 5\' 2"  (1.575 m)   Wt 95.3 kg   SpO2 97%   BMI 38.41 kg/m   Physical Exam Vitals and nursing note reviewed.  Constitutional:      General: She is not in acute distress.    Appearance: Normal appearance. She is well-developed.  HENT:     Head: Normocephalic and atraumatic.     Right Ear: Hearing normal.     Left Ear: Hearing normal.     Nose: Nose normal.  Eyes:     Conjunctiva/sclera: Conjunctivae normal.     Pupils: Pupils are equal, round, and reactive to light.  Cardiovascular:     Rate and Rhythm: Regular rhythm.     Heart sounds: S1 normal and S2 normal. No murmur heard.   No friction rub. No gallop.  Pulmonary:     Effort: Pulmonary effort is normal. No respiratory distress.     Breath sounds: Normal breath sounds.  Chest:     Chest wall: No tenderness.  Abdominal:     General: Bowel sounds are normal.     Palpations: Abdomen is soft.     Tenderness: There is abdominal tenderness in the epigastric  area. There is no guarding or rebound. Negative signs include Murphy's sign and McBurney's sign.     Hernia: No hernia is present.  Musculoskeletal:        General: Normal range of motion.     Cervical back: Normal range of motion and neck supple.  Skin:    General: Skin is warm and dry.     Findings: No rash.  Neurological:     Mental Status: She is alert and oriented to person, place, and time.     GCS: GCS eye subscore is 4. GCS verbal subscore is 5. GCS motor subscore is 6.     Cranial Nerves: No cranial nerve deficit.     Sensory: No sensory deficit.     Coordination: Coordination normal.  Psychiatric:        Speech: Speech normal.        Behavior: Behavior normal.        Thought Content: Thought content normal.    ED Results / Procedures / Treatments   Labs (all labs ordered are listed, but only abnormal results are displayed) Labs Reviewed  COMPREHENSIVE METABOLIC PANEL - Abnormal; Notable for the following components:      Result Value   Glucose, Bld 111 (*)    Creatinine, Ser 1.57 (*)    GFR, Estimated 45 (*)    All other components within normal limits  URINALYSIS, ROUTINE W REFLEX MICROSCOPIC - Abnormal; Notable for the following components:   Color, Urine STRAW (*)    All other components within normal limits  LIPASE, BLOOD  CBC  I-STAT BETA HCG BLOOD, ED ([facility], [facility], [facility] ONLY)    EKG None  Radiology ABDOMEN LIMITED RUQ (LIVER/GB)  Result Date: 04/04/2021 CLINICAL DATA:  Right upper quadrant pain EXAM: ULTRASOUND ABDOMEN LIMITED RIGHT UPPER QUADRANT COMPARISON:  None. FINDINGS: Gallbladder: Shadowing 2.4 cm gallstone seen towards the gallbladder neck. Gallbladder wall is borderline thickened at 3.3 mm albeit without pericholecystic fluid or a positive sonographic Murphy sign. Common bile duct: Diameter: 3 mm, nondilated Liver: No focal lesion identified. Within normal limits in parenchymal echogenicity. Portal vein is patent on color Doppler imaging with normal  direction of blood flow towards the liver. Other: None. IMPRESSION: Cholelithiasis with only borderline gallbladder wall thickening. Absence of pericholecystic fluid or a sonographic 04/06/2021  sign are reassuring though the overall appearance is somewhat equivocal. Recommend correlation with clinical findings and if further interrogation is warranted, HIDA could be obtained. Electronically Signed   By: Kreg Shropshire M.D.   On: 04/04/2021 02:14    Procedures Procedures   Medications Ordered in ED Medications  sodium chloride 0.9 % bolus 1,000 mL (0 mLs Intravenous Stopped 04/04/21 0235)  ondansetron (ZOFRAN) injection 4 mg (4 mg Intravenous Given 04/04/21 0128)  HYDROmorphone (DILAUDID) injection 1 mg (1 mg Intravenous Given 04/04/21 0128)  famotidine (PEPCID) IVPB 20 mg premix (0 mg Intravenous Stopped 04/04/21 0235)    ED Course  I have reviewed the triage vital signs and the nursing notes.  Pertinent labs & imaging results that were available during my care of the patient were reviewed by me and considered in my medical decision making (see chart for details).    MDM Rules/Calculators/A&P                          Patient presents with abdominal pain.  She first felt the pain after eating and now the pain has become more continuous.  Pain is in the upper abdomen.  Lab work was reassuring.  Patient underwent ultrasound which does show cholelithiasis but no evidence of acute cholecystitis at this time.  Patient treated with analgesia.  She will be discharged, outpatient follow-up with general surgery.  Final Clinical Impression(s) / ED Diagnoses Final diagnoses:  Calculus of gallbladder without cholecystitis without obstruction    Rx / DC Orders ED Discharge Orders          Ordered    famotidine (PEPCID) 20 MG tablet  2 times daily        04/04/21 0335    HYDROcodone-acetaminophen (NORCO/VICODIN) 5-325 MG tablet  Every 4 hours PRN        04/04/21 0335             Gilda Crease, MD 04/04/21 [phone]
